# Patient Record
Sex: Male | Born: 1955 | Race: Black or African American | Hispanic: No | Marital: Single | Smoking: Former smoker
Health system: Southern US, Community
[De-identification: ages and names within clinical notes are randomized; demographics above are authoritative.]

## PROBLEM LIST (undated history)

## (undated) DIAGNOSIS — I10 Essential (primary) hypertension: Secondary | ICD-10-CM

## (undated) DIAGNOSIS — E785 Hyperlipidemia, unspecified: Secondary | ICD-10-CM

## (undated) DIAGNOSIS — I639 Cerebral infarction, unspecified: Secondary | ICD-10-CM

---

## 2001-12-14 ENCOUNTER — Encounter: Payer: Self-pay | Admitting: Emergency Medicine

## 2001-12-14 ENCOUNTER — Inpatient Hospital Stay (HOSPITAL_COMMUNITY): Admission: EM | Admit: 2001-12-14 | Discharge: 2001-12-15 | Payer: Self-pay | Admitting: Emergency Medicine

## 2001-12-15 ENCOUNTER — Encounter: Payer: Self-pay | Admitting: Internal Medicine

## 2001-12-26 ENCOUNTER — Encounter: Admission: RE | Admit: 2001-12-26 | Discharge: 2002-03-26 | Payer: Self-pay | Admitting: Neurology

## 2002-09-10 ENCOUNTER — Emergency Department (HOSPITAL_COMMUNITY): Admission: EM | Admit: 2002-09-10 | Discharge: 2002-09-10 | Payer: Self-pay | Admitting: Emergency Medicine

## 2002-09-10 ENCOUNTER — Encounter: Payer: Self-pay | Admitting: Emergency Medicine

## 2011-05-24 ENCOUNTER — Encounter (HOSPITAL_COMMUNITY): Payer: Self-pay | Admitting: *Deleted

## 2011-05-24 ENCOUNTER — Emergency Department (HOSPITAL_COMMUNITY)
Admission: EM | Admit: 2011-05-24 | Discharge: 2011-05-24 | Disposition: A | Discharge status: Home / Self Care | Payer: BC Managed Care – PPO | Attending: Emergency Medicine | Admitting: Emergency Medicine

## 2011-05-24 DIAGNOSIS — Z8679 Personal history of other diseases of the circulatory system: Secondary | ICD-10-CM | POA: Insufficient documentation

## 2011-05-24 DIAGNOSIS — R300 Dysuria: Secondary | ICD-10-CM | POA: Insufficient documentation

## 2011-05-24 DIAGNOSIS — E785 Hyperlipidemia, unspecified: Secondary | ICD-10-CM | POA: Insufficient documentation

## 2011-05-24 DIAGNOSIS — N390 Urinary tract infection, site not specified: Secondary | ICD-10-CM

## 2011-05-24 DIAGNOSIS — Z79899 Other long term (current) drug therapy: Secondary | ICD-10-CM | POA: Insufficient documentation

## 2011-05-24 DIAGNOSIS — E119 Type 2 diabetes mellitus without complications: Secondary | ICD-10-CM | POA: Insufficient documentation

## 2011-05-24 DIAGNOSIS — I10 Essential (primary) hypertension: Secondary | ICD-10-CM | POA: Insufficient documentation

## 2011-05-24 HISTORY — DX: Hyperlipidemia, unspecified: E78.5

## 2011-05-24 HISTORY — DX: Cerebral infarction, unspecified: I63.9

## 2011-05-24 HISTORY — DX: Essential (primary) hypertension: I10

## 2011-05-24 LAB — URINALYSIS, ROUTINE W REFLEX MICROSCOPIC
Bilirubin Urine: NEGATIVE
Glucose, UA: NEGATIVE mg/dL
Ketones, ur: NEGATIVE mg/dL
Protein, ur: 100 mg/dL — AB

## 2011-05-24 LAB — URINE MICROSCOPIC-ADD ON

## 2011-05-24 MED ORDER — PHENAZOPYRIDINE HCL 200 MG PO TABS: 200.0000 mg | ORAL_TABLET | Freq: Three times a day (TID) | ORAL | Status: AC

## 2011-05-24 MED ORDER — CIPROFLOXACIN HCL 500 MG PO TABS: 500.0000 mg | ORAL_TABLET | Freq: Two times a day (BID) | ORAL | Status: AC

## 2011-05-24 NOTE — ED Notes (Addendum)
Patient complaining of urinary frequency since last night and dark-colored urine; patient denies history of UTIs.  Patient does report past history of kidney stones.  Patient denies pain at this time.  Patient alert and oriented x4; PERRL present.  Will continue to monitor.

## 2011-05-24 NOTE — ED Provider Notes (Signed)
Medical screening examination/treatment/procedure(s) were performed by non-physician practitioner and as supervising physician I was immediately available for consultation/collaboration.    Celene Kras, MD 05/24/11 2152

## 2011-05-24 NOTE — ED Notes (Signed)
Patient given discharge paperwork; went over discharge instructions with patient.  Instructed patient to take Cipro and Pyridium as directed, to follow up with primary care physician within three days, and to return to the ED with new, worsening, or concerning symptoms.

## 2011-05-24 NOTE — ED Notes (Signed)
Patient report onset of urinary frequency and only small amount of urine output since last night.  Patient also reports burning when urinating

## 2011-05-24 NOTE — Discharge Instructions (Signed)
Urinary Tract Infection Infections of the urinary tract can start in several places. A bladder infection (cystitis), a kidney infection (pyelonephritis), and a prostate infection (prostatitis) are different types of urinary tract infections (UTIs). They usually get better if treated with medicines (antibiotics) that kill germs. Take all the medicine until it is gone. You or your child may feel better in a few days, but TAKE ALL MEDICINE or the infection may not respond and may become more difficult to treat. HOME CARE INSTRUCTIONS   Drink enough water and fluids to keep the urine clear or pale yellow. Cranberry juice is especially recommended, in addition to large amounts of water.   Avoid caffeine, tea, and carbonated beverages. They tend to irritate the bladder.   Alcohol may irritate the prostate.   Only take over-the-counter or prescription medicines for pain, discomfort, or fever as directed by your caregiver.  To prevent further infections:  Empty the bladder often. Avoid holding urine for long periods of time.   After a bowel movement, women should cleanse from front to back. Use each tissue only once.   Empty the bladder before and after sexual intercourse.  FINDING OUT THE RESULTS OF YOUR TEST Not all test results are available during your visit. If your or your child's test results are not back during the visit, make an appointment with your caregiver to find out the results. Do not assume everything is normal if you have not heard from your caregiver or the medical facility. It is important for you to follow up on all test results. SEEK MEDICAL CARE IF:   There is back pain.   Your baby is older than 3 months with a rectal temperature of 100.5 F (38.1 C) or higher for more than 1 day.   Your or your child's problems (symptoms) are no better in 3 days. Return sooner if you or your child is getting worse.  SEEK IMMEDIATE MEDICAL CARE IF:   There is severe back pain or lower  abdominal pain.   You or your child develops chills.   You have a fever.   Your baby is older than 3 months with a rectal temperature of 102 F (38.9 C) or higher.   Your baby is 20 months old or younger with a rectal temperature of 100.4 F (38 C) or higher.   There is nausea or vomiting.   There is continued burning or discomfort with urination.  MAKE SURE YOU:   Understand these instructions.   Will watch your condition.   Will get help right away if you are not doing well or get worse.  Document Released: 12/22/2004 Document Revised: 11/24/2010 Document Reviewed: 07/27/2006 [facility] Patient Information 2012 [place], [place]. Your urine was cultured.  Please make a phone with Dr. Kevan Ny for followup testing, when you're antibiotics have been completed.  Please monitor your blood sugars carefully over the next week

## 2011-05-24 NOTE — ED Notes (Signed)
Urine culture requisition slip sent to lab as an add-on.

## 2011-05-24 NOTE — ED Notes (Signed)
Patient asked to change into gown. 

## 2011-05-24 NOTE — ED Provider Notes (Signed)
History     CSN: [id number]  Arrival date & time 05/24/11  1811   First MD Initiated Contact with Patient 05/24/11 2018      Chief Complaint  Patient presents with  . Urinary Frequency    (Consider location/radiation/quality/duration/timing/severity/associated sxs/prior treatment) HPI Comments: Patient with urinary frequency.  Last night has progressively gotten worse throughout the day.  He also states that when he sits down.  He feels some discomfort in his perineal area.  He does take his blood pressure medicine every other day.  He takes his diabetic medication on a daily basis.  He has not seen Dr. Kevan Ny since November  The history is provided by the patient.    Past Medical History  Diagnosis Date  . Hypertension   . Stroke   . Diabetes mellitus   . Hyperlipemia     History reviewed. No pertinent past surgical history.  No family history on file.  History  Substance Use Topics  . Smoking status: Never Smoker   . Smokeless tobacco: Not on file  . Alcohol Use: Yes      Review of Systems  Constitutional: Negative for fever.  Genitourinary: Positive for dysuria, urgency, frequency and decreased urine volume. Negative for discharge, penile swelling, penile pain and testicular pain.    Allergies  Shellfish allergy  Home Medications   Current Outpatient Rx  Name Route Sig Dispense Refill  . ASPIRIN EC 325 MG PO TBEC Oral Take 325 mg by mouth daily.    Marland Kitchen VITAMIN D 1000 UNITS PO TABS Oral Take 1,000 Units by mouth daily.    Marland Kitchen LISINOPRIL-HYDROCHLOROTHIAZIDE 10-12.5 MG PO TABS Oral Take 1 tablet by mouth every other day.    Marland Kitchen METFORMIN HCL 500 MG PO TABS Oral Take 500 mg by mouth 2 (two) times daily with a meal.    . SIMVASTATIN 40 MG PO TABS Oral Take 40 mg by mouth at bedtime.    Marland Kitchen CIPROFLOXACIN HCL 500 MG PO TABS Oral Take 1 tablet (500 mg total) by mouth every 12 (twelve) hours. 10 tablet 0  . PHENAZOPYRIDINE HCL 200 MG PO TABS Oral Take 1 tablet (200 mg  total) by mouth 3 (three) times daily. 6 tablet 0    BP 112/78  Pulse 101  Temp(Src) 98.5 F (36.9 C) (Oral)  Resp 16  SpO2 98%  Physical Exam  Constitutional: He is oriented to person, place, and time. He appears well-developed and well-nourished.  HENT:  Head: Normocephalic.  Neck: Normal range of motion.  Pulmonary/Chest: Effort normal.  Abdominal: Bowel sounds are normal. There is no tenderness.  Genitourinary: Rectum normal and prostate normal.  Neurological: He is alert and oriented to person, place, and time.  Skin: Skin is warm.    ED Course  Procedures (including critical care time)  Labs Reviewed  URINALYSIS, ROUTINE W REFLEX MICROSCOPIC - Abnormal; Notable for the following:    APPearance CLOUDY (*)    Hgb urine dipstick LARGE (*)    Protein, ur 100 (*)    Nitrite POSITIVE (*)    Leukocytes, UA LARGE (*)    All other components within normal limits  URINE MICROSCOPIC-ADD ON - Abnormal; Notable for the following:    Bacteria, UA FEW (*)    All other components within normal limits  URINE CULTURE   No results found.   1. Urinary tract infection       MDM  Prostate is, nontender.  We'll treat for UTI with Cipro and Pyridium for  one week        Arman Filter, NP 05/24/11 2112  Arman Filter, NP 05/24/11 2112

## 2011-05-24 NOTE — ED Notes (Signed)
Gail, FNP at bedside. 

## 2011-05-27 LAB — URINE CULTURE

## 2011-05-28 NOTE — ED Notes (Signed)
Treated per protocol MD; Sensitive to same 

## 2013-06-27 ENCOUNTER — Encounter: Payer: Self-pay | Admitting: Neurology

## 2013-06-27 ENCOUNTER — Ambulatory Visit (INDEPENDENT_AMBULATORY_CARE_PROVIDER_SITE_OTHER): Payer: BC Managed Care – PPO | Admitting: Neurology

## 2013-06-27 VITALS — BP 140/80 | HR 78 | Temp 98.2°F | Resp 18 | Ht 71.0 in | Wt 223.4 lb

## 2013-06-27 DIAGNOSIS — E1165 Type 2 diabetes mellitus with hyperglycemia: Secondary | ICD-10-CM

## 2013-06-27 DIAGNOSIS — I679 Cerebrovascular disease, unspecified: Secondary | ICD-10-CM | POA: Insufficient documentation

## 2013-06-27 DIAGNOSIS — I10 Essential (primary) hypertension: Secondary | ICD-10-CM

## 2013-06-27 DIAGNOSIS — E119 Type 2 diabetes mellitus without complications: Secondary | ICD-10-CM | POA: Insufficient documentation

## 2013-06-27 DIAGNOSIS — IMO0002 Reserved for concepts with insufficient information to code with codable children: Secondary | ICD-10-CM

## 2013-06-27 DIAGNOSIS — E785 Hyperlipidemia, unspecified: Secondary | ICD-10-CM

## 2013-06-27 DIAGNOSIS — E118 Type 2 diabetes mellitus with unspecified complications: Secondary | ICD-10-CM

## 2013-06-27 NOTE — Progress Notes (Signed)
NEUROLOGY CONSULTATION NOTE  Jason Benton MRN: [id number] DOB: [birth year]  Referring provider: Dr. Kevan NyGates Primary care provider: Dr. Kevan NyGates  Reason for consult:  History of stroke.  Yearly physical as required by DOT.  HISTORY OF PRESENT ILLNESS: Jason Benton is a 58 year old right-handed man with history of type II diabetes mellitus, hypertension, hyperlipidemia, chronic kidney disease stage III, BPH, erectile dysfunction, prior pulmonary sarcoidosis, vitamin d deficiency and history of stroke (2003) who presents for stroke.  Records and images were personally reviewed where available.    He had a stroke in 2003.  He presented with left-sided weakness, involving his arm and leg.  He was found to have a lacunar infarct involving the right posterior limb of the internal capsule which extended into the periventricular white matter.  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  He was started on aspirin and Plavix, and the Plavix was eventually discontinued.  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  He has had difficulty controlling his blood pressure, diabetes and hyperlipidemia.  He does not adhere to a diabetic or cardiac diet.  He does not exercise.  He still has some trouble with his left leg when he walks.  He has not had any further strokes.  He works as a Naval architecttruck driver for [employer] and requires yearly evaluation by a neurologist.  10/14/12 LABS:  Hgb A1c 11.5, serum glucose 267, LDL 115, BUN 25, Cr 1.60, TSH 0.70, Vit D 25(OH) 23.3  PAST MEDICAL HISTORY: Past Medical History  Diagnosis Date  . Hypertension   . Stroke   . Diabetes mellitus   . Hyperlipemia     PAST SURGICAL HISTORY: No past surgical history on file.  MEDICATIONS: Current Outpatient Prescriptions on File Prior to Visit    Medication Sig Dispense Refill  . aspirin EC 325 MG tablet Take 325 mg by mouth daily.      . cholecalciferol (VITAMIN D) 1000 UNITS tablet Take 1,000 Units by mouth daily.      Marland Kitchen. lisinopril-hydrochlorothiazide (PRINZIDE,ZESTORETIC) 10-12.5 MG per tablet Take 1 tablet by mouth every other day.      . metFORMIN (GLUCOPHAGE) 500 MG tablet Take 500 mg by mouth 2 (two) times daily with a meal.      . simvastatin (ZOCOR) 40 MG tablet Take 40 mg by mouth at bedtime.       No current facility-administered medications on file prior to visit.    ALLERGIES: Allergies  Allergen Reactions  . Shellfish Allergy     FAMILY HISTORY: No family history on file.  SOCIAL HISTORY: History   Social History  . Marital Status: Single    Spouse Name: N/A    Number of Children: N/A  . Years of Education: N/A   Occupational History  . Not on file.   Social History Main Topics  . Smoking status: Former Games developermoker  . Smokeless tobacco: Not on file  . Alcohol Use: Yes  . Drug Use: No  . Sexual Activity: Yes    Partners: Female   Other Topics Concern  . Not on file   Social History Narrative  . No narrative on file    REVIEW OF SYSTEMS: Constitutional: No fevers, chills, or sweats, no generalized fatigue, change in appetite Eyes: No visual changes, double vision, eye pain Ear, nose and  throat: No hearing loss, ear pain, nasal congestion, sore throat Cardiovascular: No chest pain, palpitations Respiratory:  No shortness of breath at rest or with exertion, wheezes GastrointestinaI: No nausea, vomiting, diarrhea, abdominal pain, fecal incontinence Genitourinary:  No dysuria, urinary retention or frequency Musculoskeletal:  No neck pain, back pain Integumentary: No rash, pruritus, skin lesions Neurological: as above Psychiatric: No depression, insomnia, anxiety Endocrine: No palpitations, fatigue, diaphoresis, mood swings, change in appetite, change in weight, increased  thirst Hematologic/Lymphatic:  No anemia, purpura, petechiae. Allergic/Immunologic: no itchy/runny eyes, nasal congestion, recent allergic reactions, rashes  PHYSICAL EXAM: Filed Vitals:   06/27/13 0932  BP: 140/80  Pulse: 78  Temp: 98.2 F (36.8 C)  Resp: 18   General: No acute distress Head:  Normocephalic/atraumatic Neck: supple, no paraspinal tenderness, full range of motion Back: No paraspinal tenderness Heart: regular rate and rhythm Lungs: Clear to auscultation bilaterally. Vascular: No carotid bruits. Neurological Exam: Mental status: alert and oriented to person, place, and time, recent and remote memory intact, fund of knowledge intact, attention and concentration intact, speech fluent and not dysarthric, language intact. Cranial nerves: CN I: not tested CN II: pupils equal, round and reactive to light, monocular central vision loss in left eye, fundi unremarkable, without vessel changes, exudates, hemorrhages or papilledema. CN III, IV, VI:  full range of motion, no nystagmus, no ptosis CN V: facial sensation intact CN VII: upper and lower face symmetric CN VIII: hearing intact CN IX, X: gag intact, uvula midline CN XI: sternocleidomastoid and trapezius muscles intact CN XII: tongue midline Bulk & Tone: normal, no fasciculations. Motor: 5/5 throughout Sensation: pinprick and vibration intact. Deep Tendon Reflexes: 3+ left brachioradialis, biceps and patellar, otherwise 2+.  Toes down. Finger to nose testing: no dysmetria Heel to shin: no dysmetria. Gait: Let leg circumduction.  No ataxia.  Able to turn, walk on toes and heels.  Mild unsteadiness walking in tandem. Romberg negative.  IMPRESSION: 1.  History of stroke, likely small vessel disease vs hypertension.  From my standpoint, he seems stable and okay to drive.  PLAN: 1.  Continue aspirin daily 2.  Optimize blood pressure and glycemic control 3.  Optimize cholesterol control (LDL goal should be less than  70). 4.  Encouraged regular exercise 5.  Encouraged Mediterranean diet.  Recommended to refer him to a nutritionist.  He wants to think about it. 6.  Will check carotid dopplers to make sure there isn't any hemodynamically significant stenosis 7.  Follow up in one year or as needed.  45 minutes spent with patient, over 50% spent counseling and coordinating care.  Thank you for allowing me to take part in the care of this patient.  Shon Millet, DO  CC:  Marden Noble, MD

## 2013-06-27 NOTE — Patient Instructions (Addendum)
1.  Continue aspirin daily 2.  Optimize blood pressure and diabetes control 3.  Optimize cholesterol control 4.  Adhere to a Mediterranean diet (consider seeing a nutritionist). 5.  Exercise at least 30 minutes 3 times a week. 6.  We will get a carotid doppler to look for any blockage of the arteries in the neck 06[facility]28/13 [facility] 11:15 [facility]  7.  Follow up in one year or as needed.

## 2013-06-28 ENCOUNTER — Other Ambulatory Visit (HOSPITAL_COMMUNITY): Payer: Self-pay | Admitting: Neurology

## 2013-06-28 ENCOUNTER — Ambulatory Visit (HOSPITAL_COMMUNITY)
Admission: RE | Admit: 2013-06-28 | Discharge: 2013-06-28 | Disposition: A | Discharge status: Home / Self Care | Payer: BC Managed Care – PPO | Source: Ambulatory Visit | Attending: Neurology | Admitting: Neurology

## 2013-06-28 DIAGNOSIS — I679 Cerebrovascular disease, unspecified: Secondary | ICD-10-CM

## 2013-06-28 DIAGNOSIS — Z8673 Personal history of transient ischemic attack (TIA), and cerebral infarction without residual deficits: Secondary | ICD-10-CM | POA: Insufficient documentation

## 2013-06-28 DIAGNOSIS — I639 Cerebral infarction, unspecified: Secondary | ICD-10-CM

## 2013-06-28 NOTE — Progress Notes (Signed)
*  PRELIMINARY RESULTS* Vascular Ultrasound Carotid Duplex (Doppler) has been completed.  Preliminary findings: Bilateral:  1-39% ICA stenosis.  Left Vertebral artery flow is antegrade.  Right vertebral artery was not visualized, possibly occluded.     Farrel DemarkJill Eunice, RDMS, RVT  06/28/2013, 11:48 AM

## 2013-07-01 ENCOUNTER — Telehealth: Payer: Self-pay | Admitting: Neurology

## 2013-07-01 NOTE — Telephone Encounter (Signed)
Patient is aware of normal test  

## 2013-07-01 NOTE — Telephone Encounter (Signed)
Pt states that someone called him he thinks it was about some test

## 2013-07-02 ENCOUNTER — Telehealth: Payer: Self-pay | Admitting: Neurology

## 2013-07-02 NOTE — Telephone Encounter (Signed)
Called patient to make him aware- patient already made aware yesterday.

## 2013-07-02 NOTE — Telephone Encounter (Signed)
Message copied by Silvio PateMCCRACKEN, JADE L on Tue Jul 02, 2013 12:08 PM ------      Message from: JAFFE, ADAM R      Created: Mon Jul 01, 2013 11:15 AM       Carotid dopplers look okay.      ----- Message -----         From: [facility] Interface         Sent: 07/01/2013  10:15 AM           To: Cira ServantAdam Robert Jaffe, DO                   ------

## 2014-05-01 ENCOUNTER — Ambulatory Visit (INDEPENDENT_AMBULATORY_CARE_PROVIDER_SITE_OTHER): Payer: Self-pay | Admitting: Family Medicine

## 2014-05-01 VITALS — BP 126/76 | HR 92 | Temp 98.2°F | Resp 18 | Ht 70.0 in | Wt 222.2 lb

## 2014-05-01 DIAGNOSIS — Z024 Encounter for examination for driving license: Secondary | ICD-10-CM

## 2014-05-01 DIAGNOSIS — Z029 Encounter for administrative examinations, unspecified: Secondary | ICD-10-CM

## 2014-05-01 NOTE — Patient Instructions (Signed)
Return as needed or in one year

## 2014-05-01 NOTE — Progress Notes (Signed)
Physical examination:    history: Patient is here for a physical exam for his DOT card. He saw his primary care provider earlier this week and has been doing well. He goes regularly.  Past medical history:  surgeries: None  illnesses: diabetes, hypertension, hyperlipidemia   medications: See list Allergies: no known allergies  Social history:  he drives a truck locally. Single. He is trying to take better care of his diabetes now.  Review of systems:   essentially unremarkable  he sees his eye doctor regularly. Other than wearing glasses he does not have any diabetic retinopathy.   Physical exam:  healthy-appearing man in no acute distress. TMs normal. Eyes PERRLA. Fundi benign. Throat clear. Neck supple without nodes or thyromegaly. No carotid bruits. Chest is clear to auscultation. Heart regular without murmurs. Abdomen soft without masses tenderness. Normal male external genitalia, uncircumcised, no hernias. Extremities unremarkable. Pulses in feet good. Spine normal.  Assessment: Normal physical examination for DOT  hypertension  Diabetes  Plan:  one year card

## 2015-04-17 ENCOUNTER — Ambulatory Visit (INDEPENDENT_AMBULATORY_CARE_PROVIDER_SITE_OTHER): Payer: Self-pay | Admitting: Physician Assistant

## 2015-04-17 ENCOUNTER — Telehealth: Payer: Self-pay

## 2015-04-17 VITALS — BP 126/80 | HR 95 | Temp 98.3°F | Resp 20 | Ht 71.0 in | Wt 224.8 lb

## 2015-04-17 DIAGNOSIS — IMO0001 Reserved for inherently not codable concepts without codable children: Secondary | ICD-10-CM

## 2015-04-17 DIAGNOSIS — Z0289 Encounter for other administrative examinations: Secondary | ICD-10-CM

## 2015-04-17 DIAGNOSIS — E1165 Type 2 diabetes mellitus with hyperglycemia: Secondary | ICD-10-CM

## 2015-04-17 DIAGNOSIS — Z021 Encounter for pre-employment examination: Secondary | ICD-10-CM

## 2015-04-17 LAB — POCT GLYCOSYLATED HEMOGLOBIN (HGB A1C): Hemoglobin A1C: 10.4

## 2015-04-17 NOTE — Telephone Encounter (Signed)
error 

## 2015-04-17 NOTE — Progress Notes (Signed)
Commercial Driver Medical Examination   Jason Benton is a 60 y.o. male who presents today for a commercial driver fitness determination physical exam. The patient reports no problems. The following portions of the patient's history were reviewed and updated as appropriate: allergies, current medications, past family history, past medical history, past social history, past surgical history and problem list.  Hx DM2. Takes metformin 500 BID. PCP-Dr. Robley Fries at [facility]. Last saw July 2016. States his A1C at that point was "9 something". No changes to medication at that time. States he didn't know he was supposed to see his provider every 3 months if he has DM. He was diagnosed with DM 2 years ago. He does not take his sugars regularly. He does not put much emphasis on his diet. He denies cp, sob, paresthesias, blurred vision.  HLD - takes simvastatin 40 mg QD.  HTN - takes lisinopril daily.  No surghx.  Denies hx osa, liver or kidney disease, lung disease. No daytime somnolence. He does not smoke. occ alcohol use.  Review of Systems Pertinent items are noted in HPI.   Objective:    Vision/hearing:  Visual Acuity Screening   Right eye Left eye Both eyes  Without correction:     With correction:  Comments: Peripheral Vision: Right eye 85 degrees. Left eye 85 degrees.The patient can distinguish the colors red, amber and green.  Hearing Screening Comments: The patient was able to hear a forced whisper from 10 feet.  Applicant can recognize and distinguish among traffic control signals and devices showing standard red, green, and amber colors.  Corrective lenses required: Yes  Monocular Vision?: No  Hearing aid requirement: No  Physical Exam  Constitutional: He is oriented to person, place, and time. He appears well-developed and well-nourished.  HENT:  Head: Normocephalic and atraumatic.  Right Ear: Hearing, tympanic membrane, external ear and ear  canal normal.  Left Ear: Hearing, tympanic membrane, external ear and ear canal normal.  Nose: Nose normal.  Mouth/Throat: Uvula is midline, oropharynx is clear and moist and mucous membranes are normal.  Eyes: Conjunctivae, EOM and lids are normal. Pupils are equal, round, and reactive to light. Right eye exhibits no discharge. Left eye exhibits no discharge. No scleral icterus.  Neck: Trachea normal and normal range of motion. Neck supple. Carotid bruit is not present.  Cardiovascular: Normal rate, regular rhythm, normal heart sounds, intact distal pulses and normal pulses.   No murmur heard. Pulmonary/Chest: Effort normal and breath sounds normal. No respiratory distress. He has no wheezes. He has no rhonchi. He has no rales.  Abdominal: Soft. Normal appearance. There is no tenderness.  Musculoskeletal: Normal range of motion. He exhibits no edema or tenderness.  Lymphadenopathy:       Head (right side): No submental, no submandibular and no tonsillar adenopathy present.       Head (left side): No submental, no submandibular and no tonsillar adenopathy present.    He has no cervical adenopathy.  Neurological: He is alert and oriented to person, place, and time. He has normal strength and normal reflexes. No cranial nerve deficit or sensory deficit. Coordination and gait normal.  Skin: Skin is warm, dry and intact. No lesion and no rash noted.  Psychiatric: He has a normal mood and affect. His speech is normal and behavior is normal. Judgment and thought content normal.    BP 126/80 mmHg  Pulse 95  Temp(Src) 98.3 F (36.8 C) (Oral)  Resp 20  Ht   (1.803 m)  Wt 224 lb 12.8 oz (101.969 kg)  BMI 31.37 kg/m2  SpO2 97%  Labs: Comments: Sp. Gr: 1.015 Protein: Neg. Blood: Neg. Sugar: > 2000   Results for orders placed or performed in visit on 04/17/15  POCT glycosylated hemoglobin (Hb A1C)  Result Value Ref Range   Hemoglobin A1C 10.4    Assessment:    Healthy male exam.   Meets standards, but periodic monitoring required due to uncontrolled diabetes type 2.  Driver qualified only for 3 months.    Plan:    Medical examiners certificate completed and printed. Need follow-up in 3 months for recheck of diabetes type 2.  Had long discussion with pt - DOT recommends A1C under 10% although this is >10% not an automatic disqualification. I recommended he make appt with PCP asap for medication review and changes. Return in 3 months for A1C recheck or bring documentation that A1C <10%. If under 10, will certify for remainder of year.

## 2015-04-24 ENCOUNTER — Ambulatory Visit (INDEPENDENT_AMBULATORY_CARE_PROVIDER_SITE_OTHER): Payer: BLUE CROSS/BLUE SHIELD | Admitting: Physician Assistant

## 2015-04-24 VITALS — BP 98/62 | HR 90 | Temp 98.1°F | Resp 18 | Ht 71.0 in | Wt 218.0 lb

## 2015-04-24 DIAGNOSIS — R7309 Other abnormal glucose: Secondary | ICD-10-CM | POA: Diagnosis not present

## 2015-04-24 DIAGNOSIS — Z23 Encounter for immunization: Secondary | ICD-10-CM | POA: Diagnosis not present

## 2015-04-24 LAB — POCT GLYCOSYLATED HEMOGLOBIN (HGB A1C): Hemoglobin A1C: 9.7

## 2015-04-24 NOTE — Progress Notes (Signed)
   04/24/2015 1:47 PM   DOB: [birth year] / MRN: [id number]  SUBJECTIVE:  Jason Benton is a 60 y.o. male presenting for a recheck of his A1c.  Per Nicole's last note he could be given a DOT card for 1 year if his A1c is less than 10.    He is allergic to shellfish allergy.   He  has a past medical history of Hypertension; Stroke (HCC); Diabetes mellitus; and Hyperlipemia.    He  reports that he has quit smoking. He does not have any smokeless tobacco history on file. He reports that he drinks alcohol. He reports that he does not use illicit drugs. He  reports that he currently engages in sexual activity and has had male partners. The patient  has no past surgical history on file.  His family history includes Hypertension in his sister; Stroke in his father.  Review of Systems  Constitutional: Negative for fever and chills.  Eyes: Negative for blurred vision.  Respiratory: Negative for cough and shortness of breath.   Cardiovascular: Negative for chest pain.  Gastrointestinal: Negative for nausea and abdominal pain.  Genitourinary: Negative for dysuria, urgency and frequency.  Musculoskeletal: Negative for myalgias.  Skin: Negative for rash.  Neurological: Negative for dizziness, tingling and headaches.  Psychiatric/Behavioral: Negative for depression. The patient is not nervous/anxious.     Problem list and medications reviewed and updated by myself where necessary, and exist elsewhere in the encounter.   OBJECTIVE:  BP 98/62 mmHg  Pulse 90  Temp(Src) 98.1 F (36.7 C) (Oral)  Resp 18  Ht  (1.803 m)  Wt 218 lb (98.884 kg)  BMI 30.42 kg/m2  SpO2 98%  Physical Exam  Constitutional: He is oriented to person, place, and time. He appears well-developed. He does not appear ill.  Eyes: Conjunctivae and EOM are normal. Pupils are equal, round, and reactive to light.  Cardiovascular: Normal rate.   Pulmonary/Chest: Effort normal.  Abdominal: He exhibits no distension.    Musculoskeletal: Normal range of motion.  Neurological: He is alert and oriented to person, place, and time. No cranial nerve deficit. Coordination normal.  Skin: Skin is warm and dry. He is not diaphoretic.  Psychiatric: He has a normal mood and affect.  Nursing note and vitals reviewed.   Results for orders placed or performed in visit on 04/24/15 (from the past 72 hour(s))  POCT glycosylated hemoglobin (Hb A1C)     Status: None   Collection Time: 04/24/15  1:46 PM  Result Value Ref Range   Hemoglobin A1C 9.7      No results found.  ASSESSMENT AND PLAN  Glendell was seen today for labs only and immunizations.  Diagnoses and all orders for this visit:  Hemoglobin A1c 8.0 or greater: Patient with an A1c of 9.7.  Will provide a year's card per Nicole's note.  He has no other complaints today and has a PCP appointment in place.   -     POCT glycosylated hemoglobin (Hb A1C) -     Flu Vaccine QUAD 36+ mos IM    The patient was advised to call or return to clinic if he does not see an improvement in symptoms or to seek the care of the closest emergency department if he worsens with the above plan.   Deliah Boston, MHS, PA-C [facility] 04/24/2015 1:47 PM

## 2015-11-06 DIAGNOSIS — E119 Type 2 diabetes mellitus without complications: Secondary | ICD-10-CM | POA: Diagnosis not present

## 2015-12-22 DIAGNOSIS — M7580 Other shoulder lesions, unspecified shoulder: Secondary | ICD-10-CM | POA: Diagnosis not present

## 2015-12-22 DIAGNOSIS — E1140 Type 2 diabetes mellitus with diabetic neuropathy, unspecified: Secondary | ICD-10-CM | POA: Diagnosis not present

## 2015-12-22 DIAGNOSIS — Z23 Encounter for immunization: Secondary | ICD-10-CM | POA: Diagnosis not present

## 2015-12-22 DIAGNOSIS — E1165 Type 2 diabetes mellitus with hyperglycemia: Secondary | ICD-10-CM | POA: Diagnosis not present

## 2015-12-22 DIAGNOSIS — I10 Essential (primary) hypertension: Secondary | ICD-10-CM | POA: Diagnosis not present

## 2015-12-22 DIAGNOSIS — Z0001 Encounter for general adult medical examination with abnormal findings: Secondary | ICD-10-CM | POA: Diagnosis not present

## 2015-12-22 DIAGNOSIS — Z125 Encounter for screening for malignant neoplasm of prostate: Secondary | ICD-10-CM | POA: Diagnosis not present

## 2015-12-22 DIAGNOSIS — Z6830 Body mass index (BMI) 30.0-30.9, adult: Secondary | ICD-10-CM | POA: Diagnosis not present

## 2015-12-22 DIAGNOSIS — N5201 Erectile dysfunction due to arterial insufficiency: Secondary | ICD-10-CM | POA: Diagnosis not present

## 2015-12-22 DIAGNOSIS — E559 Vitamin D deficiency, unspecified: Secondary | ICD-10-CM | POA: Diagnosis not present

## 2015-12-22 DIAGNOSIS — N183 Chronic kidney disease, stage 3 (moderate): Secondary | ICD-10-CM | POA: Diagnosis not present

## 2015-12-22 DIAGNOSIS — Z79899 Other long term (current) drug therapy: Secondary | ICD-10-CM | POA: Diagnosis not present

## 2015-12-22 DIAGNOSIS — Z7984 Long term (current) use of oral hypoglycemic drugs: Secondary | ICD-10-CM | POA: Diagnosis not present

## 2016-02-12 DIAGNOSIS — L03032 Cellulitis of left toe: Secondary | ICD-10-CM | POA: Diagnosis not present

## 2016-02-12 DIAGNOSIS — S91109A Unspecified open wound of unspecified toe(s) without damage to nail, initial encounter: Secondary | ICD-10-CM | POA: Diagnosis not present

## 2016-02-25 DIAGNOSIS — T8189XA Other complications of procedures, not elsewhere classified, initial encounter: Secondary | ICD-10-CM | POA: Diagnosis not present

## 2016-02-26 DIAGNOSIS — N5201 Erectile dysfunction due to arterial insufficiency: Secondary | ICD-10-CM | POA: Diagnosis not present

## 2016-02-26 DIAGNOSIS — E1165 Type 2 diabetes mellitus with hyperglycemia: Secondary | ICD-10-CM | POA: Diagnosis not present

## 2016-02-26 DIAGNOSIS — Z79899 Other long term (current) drug therapy: Secondary | ICD-10-CM | POA: Diagnosis not present

## 2016-02-26 DIAGNOSIS — E782 Mixed hyperlipidemia: Secondary | ICD-10-CM | POA: Diagnosis not present

## 2016-02-26 DIAGNOSIS — E559 Vitamin D deficiency, unspecified: Secondary | ICD-10-CM | POA: Diagnosis not present

## 2016-03-18 DIAGNOSIS — L97522 Non-pressure chronic ulcer of other part of left foot with fat layer exposed: Secondary | ICD-10-CM | POA: Diagnosis not present

## 2016-03-18 DIAGNOSIS — T8189XA Other complications of procedures, not elsewhere classified, initial encounter: Secondary | ICD-10-CM | POA: Diagnosis not present

## 2016-04-01 DIAGNOSIS — L97522 Non-pressure chronic ulcer of other part of left foot with fat layer exposed: Secondary | ICD-10-CM | POA: Diagnosis not present

## 2016-04-03 ENCOUNTER — Inpatient Hospital Stay (HOSPITAL_COMMUNITY)
Admission: EM | Admit: 2016-04-03 | Discharge: 2016-04-09 | DRG: 504 | Disposition: A | Discharge status: Home Health | Payer: BLUE CROSS/BLUE SHIELD | Attending: Internal Medicine | Admitting: Internal Medicine

## 2016-04-03 ENCOUNTER — Encounter (HOSPITAL_COMMUNITY): Payer: Self-pay | Admitting: Emergency Medicine

## 2016-04-03 ENCOUNTER — Observation Stay (HOSPITAL_COMMUNITY): Payer: BLUE CROSS/BLUE SHIELD

## 2016-04-03 ENCOUNTER — Emergency Department (HOSPITAL_COMMUNITY): Payer: BLUE CROSS/BLUE SHIELD

## 2016-04-03 DIAGNOSIS — N400 Enlarged prostate without lower urinary tract symptoms: Secondary | ICD-10-CM | POA: Diagnosis not present

## 2016-04-03 DIAGNOSIS — E1122 Type 2 diabetes mellitus with diabetic chronic kidney disease: Secondary | ICD-10-CM | POA: Diagnosis present

## 2016-04-03 DIAGNOSIS — Z8673 Personal history of transient ischemic attack (TIA), and cerebral infarction without residual deficits: Secondary | ICD-10-CM

## 2016-04-03 DIAGNOSIS — M869 Osteomyelitis, unspecified: Secondary | ICD-10-CM | POA: Diagnosis not present

## 2016-04-03 DIAGNOSIS — Z823 Family history of stroke: Secondary | ICD-10-CM

## 2016-04-03 DIAGNOSIS — Z7984 Long term (current) use of oral hypoglycemic drugs: Secondary | ICD-10-CM

## 2016-04-03 DIAGNOSIS — L0390 Cellulitis, unspecified: Secondary | ICD-10-CM | POA: Diagnosis present

## 2016-04-03 DIAGNOSIS — E785 Hyperlipidemia, unspecified: Secondary | ICD-10-CM | POA: Diagnosis present

## 2016-04-03 DIAGNOSIS — E1151 Type 2 diabetes mellitus with diabetic peripheral angiopathy without gangrene: Secondary | ICD-10-CM | POA: Diagnosis present

## 2016-04-03 DIAGNOSIS — E119 Type 2 diabetes mellitus without complications: Secondary | ICD-10-CM

## 2016-04-03 DIAGNOSIS — I129 Hypertensive chronic kidney disease with stage 1 through stage 4 chronic kidney disease, or unspecified chronic kidney disease: Secondary | ICD-10-CM | POA: Diagnosis not present

## 2016-04-03 DIAGNOSIS — R031 Nonspecific low blood-pressure reading: Secondary | ICD-10-CM | POA: Diagnosis present

## 2016-04-03 DIAGNOSIS — L02612 Cutaneous abscess of left foot: Secondary | ICD-10-CM

## 2016-04-03 DIAGNOSIS — E1121 Type 2 diabetes mellitus with diabetic nephropathy: Secondary | ICD-10-CM | POA: Diagnosis not present

## 2016-04-03 DIAGNOSIS — M7989 Other specified soft tissue disorders: Secondary | ICD-10-CM | POA: Diagnosis not present

## 2016-04-03 DIAGNOSIS — Z91013 Allergy to seafood: Secondary | ICD-10-CM

## 2016-04-03 DIAGNOSIS — Z7982 Long term (current) use of aspirin: Secondary | ICD-10-CM | POA: Diagnosis not present

## 2016-04-03 DIAGNOSIS — E1142 Type 2 diabetes mellitus with diabetic polyneuropathy: Secondary | ICD-10-CM | POA: Diagnosis not present

## 2016-04-03 DIAGNOSIS — E1169 Type 2 diabetes mellitus with other specified complication: Secondary | ICD-10-CM | POA: Diagnosis present

## 2016-04-03 DIAGNOSIS — I10 Essential (primary) hypertension: Secondary | ICD-10-CM | POA: Diagnosis not present

## 2016-04-03 DIAGNOSIS — Z87891 Personal history of nicotine dependence: Secondary | ICD-10-CM | POA: Diagnosis not present

## 2016-04-03 DIAGNOSIS — N179 Acute kidney failure, unspecified: Secondary | ICD-10-CM

## 2016-04-03 DIAGNOSIS — M25572 Pain in left ankle and joints of left foot: Secondary | ICD-10-CM | POA: Diagnosis present

## 2016-04-03 DIAGNOSIS — L03116 Cellulitis of left lower limb: Secondary | ICD-10-CM | POA: Diagnosis not present

## 2016-04-03 DIAGNOSIS — M86672 Other chronic osteomyelitis, left ankle and foot: Principal | ICD-10-CM | POA: Diagnosis present

## 2016-04-03 DIAGNOSIS — N182 Chronic kidney disease, stage 2 (mild): Secondary | ICD-10-CM | POA: Diagnosis not present

## 2016-04-03 DIAGNOSIS — E11621 Type 2 diabetes mellitus with foot ulcer: Secondary | ICD-10-CM | POA: Diagnosis present

## 2016-04-03 DIAGNOSIS — E1165 Type 2 diabetes mellitus with hyperglycemia: Secondary | ICD-10-CM | POA: Diagnosis present

## 2016-04-03 DIAGNOSIS — Z8249 Family history of ischemic heart disease and other diseases of the circulatory system: Secondary | ICD-10-CM

## 2016-04-03 DIAGNOSIS — L03032 Cellulitis of left toe: Secondary | ICD-10-CM

## 2016-04-03 DIAGNOSIS — G8918 Other acute postprocedural pain: Secondary | ICD-10-CM | POA: Diagnosis not present

## 2016-04-03 DIAGNOSIS — I679 Cerebrovascular disease, unspecified: Secondary | ICD-10-CM | POA: Diagnosis present

## 2016-04-03 LAB — URINALYSIS, ROUTINE W REFLEX MICROSCOPIC
BACTERIA UA: NONE SEEN
Bilirubin Urine: NEGATIVE
Hgb urine dipstick: NEGATIVE
KETONES UR: NEGATIVE mg/dL
LEUKOCYTES UA: NEGATIVE
Nitrite: NEGATIVE
PROTEIN: NEGATIVE mg/dL
SQUAMOUS EPITHELIAL / LPF: NONE SEEN
Specific Gravity, Urine: 1.017 (ref 1.005–1.030)
WBC UA: NONE SEEN WBC/hpf (ref 0–5)
pH: 7 (ref 5.0–8.0)

## 2016-04-03 LAB — CBC WITH DIFFERENTIAL/PLATELET
BASOS ABS: 0 10*3/uL (ref 0.0–0.1)
Basophils Relative: 0 %
EOS PCT: 1 %
Eosinophils Absolute: 0.1 10*3/uL (ref 0.0–0.7)
HCT: 41 % (ref 39.0–52.0)
Hemoglobin: 14 g/dL (ref 13.0–17.0)
LYMPHS ABS: 1.6 10*3/uL (ref 0.7–4.0)
LYMPHS PCT: 21 %
MCH: 28.6 pg (ref 26.0–34.0)
MCHC: 34.1 g/dL (ref 30.0–36.0)
MCV: 83.8 fL (ref 78.0–100.0)
MONO ABS: 0.3 10*3/uL (ref 0.1–1.0)
MONOS PCT: 5 %
NEUTROS ABS: 5.4 10*3/uL (ref 1.7–7.7)
Neutrophils Relative %: 73 %
Platelets: 161 10*3/uL (ref 150–400)
RBC: 4.89 MIL/uL (ref 4.22–5.81)
RDW: 12 % (ref 11.5–15.5)
WBC: 7.5 10*3/uL (ref 4.0–10.5)

## 2016-04-03 LAB — PHOSPHORUS: PHOSPHORUS: 2.4 mg/dL — AB (ref 2.5–4.6)

## 2016-04-03 LAB — BASIC METABOLIC PANEL
ANION GAP: 7 (ref 5–15)
BUN: 23 mg/dL — AB (ref 6–20)
CALCIUM: 9.2 mg/dL (ref 8.9–10.3)
CO2: 25 mmol/L (ref 22–32)
CREATININE: 1.52 mg/dL — AB (ref 0.61–1.24)
Chloride: 104 mmol/L (ref 101–111)
GFR calc Af Amer: 56 mL/min — ABNORMAL LOW (ref >60)
GFR, EST NON AFRICAN AMERICAN: 48 mL/min — AB (ref >60)
GLUCOSE: 331 mg/dL — AB (ref 65–99)
Potassium: 4.3 mmol/L (ref 3.5–5.1)
Sodium: 136 mmol/L (ref 135–145)

## 2016-04-03 LAB — GLUCOSE, CAPILLARY
Glucose-Capillary: 297 mg/dL — ABNORMAL HIGH (ref 65–99)
Glucose-Capillary: 202 mg/dL — ABNORMAL HIGH (ref 65–99)

## 2016-04-03 LAB — MAGNESIUM: Magnesium: 2.2 mg/dL (ref 1.7–2.4)

## 2016-04-03 LAB — CREATININE, URINE, RANDOM: CREATININE, URINE: 62.59 mg/dL

## 2016-04-03 MED ORDER — HYDRALAZINE HCL 20 MG/ML IJ SOLN
10.0000 mg | Freq: Three times a day (TID) | INTRAMUSCULAR | Status: DC | PRN
Start: 2016-04-03 — End: 2016-04-09

## 2016-04-03 MED ORDER — SODIUM CHLORIDE 0.9 % IV SOLN
INTRAVENOUS | Status: DC
  Administered 2016-04-03 – 2016-04-05 (×4): via INTRAVENOUS

## 2016-04-03 MED ORDER — ENOXAPARIN SODIUM 60 MG/0.6ML ~~LOC~~ SOLN
0.5000 mg/kg | SUBCUTANEOUS | Status: DC
  Administered 2016-04-03 – 2016-04-08 (×6): 50 mg via SUBCUTANEOUS
  Filled 2016-04-03 (×6): qty 0.6

## 2016-04-03 MED ORDER — PIPERACILLIN-TAZOBACTAM 3.375 G IVPB
3.3750 g | Freq: Three times a day (TID) | INTRAVENOUS | Status: DC
  Administered 2016-04-03 – 2016-04-09 (×17): 3.375 g via INTRAVENOUS
  Filled 2016-04-03 (×20): qty 50

## 2016-04-03 MED ORDER — PIPERACILLIN-TAZOBACTAM 3.375 G IVPB 30 MIN
3.3750 g | Freq: Once | INTRAVENOUS | Status: AC
  Administered 2016-04-03: 3.375 g via INTRAVENOUS
  Filled 2016-04-03: qty 50

## 2016-04-03 MED ORDER — HYDROCODONE-ACETAMINOPHEN 5-325 MG PO TABS
1.0000 | ORAL_TABLET | Freq: Once | ORAL | Status: AC
  Administered 2016-04-03: 1 via ORAL
  Filled 2016-04-03: qty 1

## 2016-04-03 MED ORDER — INSULIN ASPART 100 UNIT/ML ~~LOC~~ SOLN
0.0000 [IU] | Freq: Three times a day (TID) | SUBCUTANEOUS | Status: DC
  Administered 2016-04-03: 11 [IU] via SUBCUTANEOUS
  Administered 2016-04-04: 3 [IU] via SUBCUTANEOUS
  Administered 2016-04-04: 7 [IU] via SUBCUTANEOUS
  Administered 2016-04-04: 4 [IU] via SUBCUTANEOUS
  Administered 2016-04-05 (×2): 3 [IU] via SUBCUTANEOUS
  Administered 2016-04-05 – 2016-04-06 (×3): 4 [IU] via SUBCUTANEOUS
  Administered 2016-04-07: 3 [IU] via SUBCUTANEOUS
  Administered 2016-04-07 – 2016-04-09 (×4): 4 [IU] via SUBCUTANEOUS

## 2016-04-03 MED ORDER — ACETAMINOPHEN 325 MG PO TABS
650.0000 mg | ORAL_TABLET | Freq: Four times a day (QID) | ORAL | Status: DC | PRN
  Administered 2016-04-05: 650 mg via ORAL
  Filled 2016-04-03: qty 2

## 2016-04-03 MED ORDER — SIMVASTATIN 40 MG PO TABS
40.0000 mg | ORAL_TABLET | Freq: Every day | ORAL | Status: DC
  Administered 2016-04-03 – 2016-04-08 (×6): 40 mg via ORAL
  Filled 2016-04-03 (×6): qty 1

## 2016-04-03 MED ORDER — ENOXAPARIN SODIUM 40 MG/0.4ML ~~LOC~~ SOLN: 40.0000 mg | SUBCUTANEOUS | Status: DC

## 2016-04-03 MED ORDER — TAMSULOSIN HCL 0.4 MG PO CAPS
0.4000 mg | ORAL_CAPSULE | Freq: Every day | ORAL | Status: DC
  Administered 2016-04-03 – 2016-04-09 (×6): 0.4 mg via ORAL
  Filled 2016-04-03 (×6): qty 1

## 2016-04-03 MED ORDER — MORPHINE SULFATE (PF) 2 MG/ML IV SOLN
2.0000 mg | Freq: Once | INTRAVENOUS | Status: AC
  Administered 2016-04-03: 2 mg via INTRAVENOUS
  Filled 2016-04-03: qty 1

## 2016-04-03 MED ORDER — VANCOMYCIN HCL IN DEXTROSE 1-5 GM/200ML-% IV SOLN
1000.0000 mg | Freq: Two times a day (BID) | INTRAVENOUS | Status: DC
  Administered 2016-04-03 (×2): 1000 mg via INTRAVENOUS
  Filled 2016-04-03 (×3): qty 200

## 2016-04-03 MED ORDER — RISAQUAD PO CAPS
1.0000 | ORAL_CAPSULE | Freq: Every day | ORAL | Status: DC
  Administered 2016-04-03 – 2016-04-09 (×6): 1 via ORAL
  Filled 2016-04-03 (×6): qty 1

## 2016-04-03 MED ORDER — HYDROCODONE-ACETAMINOPHEN 5-325 MG PO TABS
1.0000 | ORAL_TABLET | ORAL | Status: DC | PRN
  Administered 2016-04-03 (×3): 1 via ORAL
  Filled 2016-04-03 (×3): qty 1

## 2016-04-03 NOTE — ED Notes (Signed)
Patient transported to X-ray 

## 2016-04-03 NOTE — H&P (Signed)
[facility] History and Physical  Jason Benton BJY:[id number] DOB: 04/13/1955 DOA: 04/03/2016  Referring physician:  PCP: Jason Dubonnet, MD   Chief Complaint: "My toe just kept getting worse."  HPI: Jason Benton is a 61 y.o. male  with past mental history of stroke, diabetes, hypertension, hyperlipidemia presents emergency room with chief complaint of toe pain and swelling. Patient reports that he had left great toenail pain. Went to a podiatrist for evaluation and the toenails removed. This is approximately 2 weeks ago. Patient will follow-up as toe was red and looked a little infected. Patient was prescribed an antibiotic pill and cream. Patient took the pill as directed. The cream was to come to order never arrived. Patient went for evaluation after doing therapy without resolution. Podiatrist did not change any medications. Patient came to the emergency room for evaluation.  Patient states that when walking long distances his calves go numb. When he sits to rest the feeling comes back. Patient states that when he goes to the bathroom to urinate he has to stand over the commode because the urine comes out with low pressure. He wakes up frequently at night to urinate. Denies history of kidney issues.  ED course: Patient started on vancomycin and Zosyn for cellulitis hospitalists consulted for admission.   Review of Systems:  As per HPI otherwise 10 point review of systems negative.    Past Medical History:  Diagnosis Date  . Diabetes mellitus   . Hyperlipemia   . Hypertension   . Stroke [facility])    History reviewed. No pertinent surgical history. Social History:  reports that he has quit smoking. He has quit using smokeless tobacco. He reports that he drinks alcohol. He reports that he does not use drugs.  Allergies  Allergen Reactions  . Shellfish Allergy     Family History  Problem Relation Age of Onset  . Stroke Father   . Hypertension Sister      Prior to  Admission medications   Medication Sig Start Date End Date Taking? Authorizing Provider  aspirin EC 325 MG tablet Take 325 mg by mouth daily.    Historical Provider, MD  canagliflozin (INVOKANA) 300 MG TABS tablet Take 300 mg by mouth daily before breakfast.    Historical Provider, MD  cholecalciferol (VITAMIN D) 1000 UNITS tablet Take 1,000 Units by mouth daily.    Historical Provider, MD  lisinopril-hydrochlorothiazide (PRINZIDE,ZESTORETIC) 10-12.5 MG per tablet Take 1 tablet by mouth every other day.    Historical Provider, MD  metFORMIN (GLUCOPHAGE) 500 MG tablet Take 500 mg by mouth 2 (two) times daily with a meal.    Historical Provider, MD  simvastatin (ZOCOR) 40 MG tablet Take 40 mg by mouth at bedtime.    Historical Provider, MD   Physical Exam: Vitals:   04/03/16 1015 04/03/16 1057 04/03/16 1130 04/03/16 1220  BP: 125/71 131/84 128/84 130/79  Pulse: 82 81 85 83  Resp: 18 16 16 14   Temp:      TempSrc:      SpO2: 96% 95% 96% 96%  Weight:      Height:        Wt Readings from Last 3 Encounters:  04/03/16 99.3 kg (219 lb)  04/24/15 98.9 kg (218 lb)  04/17/15 102 kg (224 lb 12.8 oz)    General:  Appears calm and comfortable; A&Ox3 Eyes:  PERRL, EOMI, normal lids, iris ENT:  grossly normal hearing, lips & tongue Neck:  no LAD, masses or thyromegaly Cardiovascular:  RRR, no  m/r/g. No LE edema.  Respiratory:  CTA bilaterally, no w/r/r. Normal respiratory effort. Abdomen:  soft, ntnd Skin:  Left foot with induration to distally at base of toes. 1st digit swollen and tender on L Musculoskeletal:  grossly normal tone BUE/BLE Psychiatric:  grossly normal mood and affect, speech fluent and appropriate Neurologic:  CN 2-12 grossly intact, moves all extremities in coordinated fashion. Rectal: Normal sphincter tone, normal anal wink, no masses, stool in rectal vault, normal prostate           Labs on Admission:  Basic Metabolic Panel:  Recent Labs Lab 04/03/16 1015  NA 136    K 4.3  CL 104  CO2 25  GLUCOSE 331*  BUN 23*  CREATININE 1.52*  CALCIUM 9.2   Liver Function Tests: No results for input(s): AST, ALT, ALKPHOS, BILITOT, PROT, ALBUMIN in the last 168 hours. No results for input(s): LIPASE, AMYLASE in the last 168 hours. No results for input(s): AMMONIA in the last 168 hours. CBC:  Recent Labs Lab 04/03/16 1015  WBC 7.5  NEUTROABS 5.4  HGB 14.0  HCT 41.0  MCV 83.8  PLT 161   Cardiac Enzymes: No results for input(s): CKTOTAL, CKMB, CKMBINDEX, TROPONINI in the last 168 hours.  BNP (last 3 results) No results for input(s): BNP in the last 8760 hours.  ProBNP (last 3 results) No results for input(s): PROBNP in the last 8760 hours.   Serum creatinine: 1.52 mg/dL High 62/95/2799/10/13 [id number] Estimated creatinine clearance: 62.1 mL/min  CBG: No results for input(s): GLUCAP in the last 168 hours.  Radiological Exams on Admission: Dg Foot Complete Left  Result Date: 04/03/2016 CLINICAL DATA:  Left first toe swelling EXAM: LEFT FOOT - COMPLETE 3+ VIEW COMPARISON:  None. FINDINGS: Mild soft tissue swelling of the first toe is noted. No acute fracture or dislocation is seen. No bony erosion is noted. Mild vascular calcifications are seen. IMPRESSION: No acute bony abnormality noted. Electronically Signed   By: Alcide CleverMark  Lukens M.D.   On: 04/03/2016 10:44    I independently reviewed the foot x-ray and agree with the impression of the radiologist. Findings include soft tissue swelling.   EKG: ordered  Assessment/Plan Principal Problem:   Cellulitis Active Problems:   Diabetes (HCC)   Hyperlipidemia with target low density lipoprotein (LDL) cholesterol less than 70 mg/dL   Essential hypertension, benign   Cerebrovascular disease   AKI (acute kidney injury) (HCC)  Cellulitis Continue vancomycin and Zosyn Patient admitted for outpatient treatment failure Will try to transition him to oral medication after some improvement presentation, likely due to  profound vascular disease Doppler revealed triphasic pulse DP PT bilaterally, poor blood flow likely delaying healing Patient is unsure of antibiotic he was on prior to arrival, will check outside records  AKI Patient denies past history of chronic kidney disease Creatinine 1.52 on admission Follow I&Os Magnesium phosphorus ordered UA ordered Checking fractional excretion of urea Kidney ultrasound ordered, will consider CT urogram in the future Avoid nephro toxic drugs Gentle IV hydration Based on history patient likely has CKD due to long-standing urinary obstruction.retention  Symptoms of BPH Starting patient on Flomax Check post for residual  Peripheral vascular disease Will need follow-up as outpatient for this issue.  Diabetes Sliding scale insulin Hold home medications  HLD Continue statin  Hypertension When necessary hydralazine 10 mg IV as needed for severe blood pressure Hold home antihypertensives  Code Status: FULL  DVT Prophylaxis: Lovenox Family Communication: GF at bedside, pt will contatc dgtr  who will be his surrogate decision maker Disposition Plan: Pending Improvement  Status: obs, med-surg  Haydee Salter, MD Family Medicine [facility] [email or web address] Password [email or web address]

## 2016-04-03 NOTE — Progress Notes (Signed)
Texted Dr. Melynda RippleHobbs about adding CBGs and sliding scale insulin to orders.

## 2016-04-03 NOTE — Progress Notes (Signed)
New pt admission from ED. Pt brought to the floor in stable condition. Vitals taken. Initial Assessment done. Pt great L toenail removed and foot red and painful to touch. All immediate pertinent needs to patient addressed. Patient Guide given to patient. Important safety instructions relating to hospitalization reviewed with patient. Patient verbalized understanding. Will continue to monitor pt.  Jilda PandaBethany Carolyn Sylvia RN

## 2016-04-03 NOTE — ED Provider Notes (Signed)
[facility]-EMERGENCY DEPT Provider Note   CSN: [id number] Arrival date & time: 04/03/16  7829     History   Chief Complaint Chief Complaint  Patient presents with  . Post-op Problem  . Toe Pain    HPI Jason Benton is a 61 y.o. male.  HPI Jason Benton is a 61 y.o. male with history of diabetes, hypertension, hyperlipidemia, CVA, presents to emergency department complaining of pain and swelling to the left big toe. Patient states that he had an ingrown toenail which was removed by a podiatrist one month ago. He has had multiple follow-ups since then, last seen 2 days ago. He states since the toenail removal he has developed swelling and redness to the toe. He also states he had a "blister" that has developed on the bottom of the toe which was debrided by this podiatrist a few weeks ago as well. She reports that when he was seen 2 days ago he was told he had poor circulation in his feet and he was going to be referred to a vascular specialist. He states that in the last 2 days since his appointment with the pain has gotten worse. He reports pain is better when he is actually standing on his feet but worse when he is sitting down or laying down. He reports some numbness to bilateral feet. Denies any fever or chills. He states he was on antibiotic and finished it last week. He was not restarted on any antibiotics 2 days ago. Denies any fever or chills.  Past Medical History:  Diagnosis Date  . Diabetes mellitus   . Hyperlipemia   . Hypertension   . Stroke [facility])     Patient Active Problem List   Diagnosis Date Noted  . Type II or unspecified type diabetes mellitus with unspecified complication, uncontrolled 06/27/2013  . Hyperlipidemia LDL goal < 70 06/27/2013  . Essential hypertension, benign 06/27/2013  . Cerebrovascular disease 06/27/2013    History reviewed. No pertinent surgical history.     Home Medications    Prior to Admission medications   Medication Sig Start Date End Date  Taking? Authorizing Provider  aspirin EC 325 MG tablet Take 325 mg by mouth daily.    Historical Provider, MD  canagliflozin (INVOKANA) 300 MG TABS tablet Take 300 mg by mouth daily before breakfast.    Historical Provider, MD  cholecalciferol (VITAMIN D) 1000 UNITS tablet Take 1,000 Units by mouth daily.    Historical Provider, MD  lisinopril-hydrochlorothiazide (PRINZIDE,ZESTORETIC) 10-12.5 MG per tablet Take 1 tablet by mouth every other day.    Historical Provider, MD  metFORMIN (GLUCOPHAGE) 500 MG tablet Take 500 mg by mouth 2 (two) times daily with a meal.    Historical Provider, MD  simvastatin (ZOCOR) 40 MG tablet Take 40 mg by mouth at bedtime.    Historical Provider, MD    Family History Family History  Problem Relation Age of Onset  . Stroke Father   . Hypertension Sister     Social History Social History  Substance Use Topics  . Smoking status: Former Games developer  . Smokeless tobacco: Former Neurosurgeon  . Alcohol use Yes     Allergies   Shellfish allergy   Review of Systems Review of Systems  Constitutional: Negative for chills and fever.  Respiratory: Negative for cough, chest tightness and shortness of breath.   Cardiovascular: Negative for chest pain, palpitations and leg swelling.  Gastrointestinal: Negative for abdominal distention, abdominal pain, diarrhea, nausea and vomiting.  Genitourinary: Negative for dysuria,  frequency, hematuria and urgency.  Musculoskeletal: Positive for arthralgias and myalgias. Negative for neck pain and neck stiffness.  Skin: Positive for wound. Negative for rash.  Allergic/Immunologic: Negative for immunocompromised state.  Neurological: Negative for dizziness, weakness, light-headedness, numbness and headaches.  All other systems reviewed and are negative.    Physical Exam Updated Vital Signs BP 130/93 (BP Location: Left Arm)   Pulse 95   Temp 98.1 F (36.7 C) (Oral)   Resp 17   Ht 5\' 11"  (1.803 m)   Wt 99.3 kg   SpO2 95%    BMI 30.54 kg/m   Physical Exam  Constitutional: He appears well-developed and well-nourished. No distress.  HENT:  Head: Normocephalic and atraumatic.  Eyes: Conjunctivae are normal.  Neck: Neck supple.  Cardiovascular: Normal rate, regular rhythm and normal heart sounds.   Pulmonary/Chest: Effort normal. No respiratory distress. He has no wheezes. He has no rales.  Abdominal: Soft. Bowel sounds are normal. He exhibits no distension. There is no tenderness. There is no rebound.  Musculoskeletal: He exhibits no edema.  Left great toe is erythematous, swollen. Erythema extends into the distal foot and there is a streak going up to the dorsal foot. Toe and dorsal foot tender to palpation. There is a new nail growing. There is a discolored callus to the medial aspect of the toe with surrounding swelling and tenderness. No drainage. Dorsal pedal pulses and posterior tibial pulses are palpated with the pencil Doppler. Dorsal pedal pulses are faint. Cap refill <2 sec  Neurological: He is alert.  Skin: Skin is warm and dry.  Nursing note and vitals reviewed.          ED Treatments / Results  Labs (all labs ordered are listed, but only abnormal results are displayed) Labs Reviewed  CBC WITH DIFFERENTIAL/PLATELET  BASIC METABOLIC PANEL    EKG  EKG Interpretation None       Radiology No results found.  Procedures Procedures (including critical care time)  Medications Ordered in ED Medications - No data to display   Initial Impression / Assessment and Plan / ED Course  I have reviewed the triage vital signs and the nursing notes.  Pertinent labs & imaging results that were available during my care of the patient were reviewed by me and considered in my medical decision making (see chart for details).  Clinical Course    Pt in ED with left toe cellulitis. Pt afebrile, non toxic appearing will check labs and xray.   Pt's xray negative. Labs unremarkable other than  glucose of 331, pt states he just ate breakfast. Creat 1.5, no prior.  Discussed with Dr. Freida Busman. Insetting of diabetes, poor circulation to that foot, nonhealing infection to the toe with a recent antibiotics, will start on IV antibiotics and admit for cellulitis.  Spoke with hospitalist will admit. Started on vanc and zosyn   Vitals:   04/03/16 1015 04/03/16 1057 04/03/16 1130 04/03/16 1220  BP: 125/71 131/84 128/84 130/79  Pulse: 82 81 85 83  Resp: 18 16 16 14   Temp:      TempSrc:      SpO2: 96% 95% 96% 96%  Weight:      Height:         Final Clinical Impressions(s) / ED Diagnoses   Final diagnoses:  Cellulitis and abscess of toe of left foot  AKI (acute kidney injury) (HCC)    New Prescriptions New Prescriptions   No medications on file     Jaynie Crumble,  PA-C 04/03/16 1248    Lorre NickAnthony Allen, MD 04/05/16 [phone]

## 2016-04-03 NOTE — ED Notes (Signed)
Pt returned from xray

## 2016-04-03 NOTE — ED Triage Notes (Signed)
Pt. Stated, I had toe surgery on Decf. 1 , it was to take a toenail off and its not healed at all and its so painful

## 2016-04-03 NOTE — ED Notes (Signed)
Attempted report 

## 2016-04-03 NOTE — Progress Notes (Signed)
Pharmacy Antibiotic Note  Jason LedgerJerry Benton is a 61 y.o. male admitted on 04/03/2016 with toe pain and swelling.  Pharmacy has been consulted for vancomycin and Zosyn dosing for cellulitis. Recent big toe nail removal by podiatrist then developed a blister on the bottom of that toe that was debrided by podiatrist. He recently completed a course of doxycycline. SCr is elevated at 1.52 with no recent labs to compare.  Plan: Vancomycin 1000 mg IV every 12 hours.  Goal trough 10-15 mcg/mL.  Zosyn 3.375 g IV q8h to be infused over 4 hours Monitor renal function, clinical progress, and LOT Vancomycin trough as clinically indicated   Height: 5\' 11"  (180.3 cm) Weight: 219 lb (99.3 kg) IBW/kg (Calculated) : 75.3  Temp (24hrs), Avg:98.1 F (36.7 C), Min:98.1 F (36.7 C), Max:98.1 F (36.7 C)   Recent Labs Lab 04/03/16 1015  WBC 7.5  CREATININE 1.52*    Estimated Creatinine Clearance: 62.1 mL/min (by C-G formula based on SCr of 1.52 mg/dL (H)).    Allergies  Allergen Reactions  . Shellfish Allergy     Antimicrobials this admission: Vancomycin 1/7 >>  Zosyn 1/7 >>   Dose adjustments this admission:   Microbiology results:   Thank you for allowing pharmacy to be a part of this patient's care.  Loura BackJennifer Severn, PharmD, BCPS Clinical Pharmacist Phone for today [phone]- [phone] Main pharmacy - [phone] 04/03/2016 12:08 PM

## 2016-04-03 NOTE — ED Notes (Signed)
Pt to US.

## 2016-04-03 NOTE — ED Notes (Signed)
Patient transported to Ultrasound 

## 2016-04-04 DIAGNOSIS — M869 Osteomyelitis, unspecified: Secondary | ICD-10-CM | POA: Diagnosis not present

## 2016-04-04 DIAGNOSIS — I129 Hypertensive chronic kidney disease with stage 1 through stage 4 chronic kidney disease, or unspecified chronic kidney disease: Secondary | ICD-10-CM | POA: Diagnosis present

## 2016-04-04 DIAGNOSIS — E1121 Type 2 diabetes mellitus with diabetic nephropathy: Secondary | ICD-10-CM | POA: Diagnosis present

## 2016-04-04 DIAGNOSIS — N182 Chronic kidney disease, stage 2 (mild): Secondary | ICD-10-CM | POA: Diagnosis present

## 2016-04-04 DIAGNOSIS — I10 Essential (primary) hypertension: Secondary | ICD-10-CM | POA: Diagnosis not present

## 2016-04-04 DIAGNOSIS — L02612 Cutaneous abscess of left foot: Secondary | ICD-10-CM | POA: Diagnosis not present

## 2016-04-04 DIAGNOSIS — E785 Hyperlipidemia, unspecified: Secondary | ICD-10-CM | POA: Diagnosis not present

## 2016-04-04 DIAGNOSIS — Z8249 Family history of ischemic heart disease and other diseases of the circulatory system: Secondary | ICD-10-CM | POA: Diagnosis not present

## 2016-04-04 DIAGNOSIS — Z91013 Allergy to seafood: Secondary | ICD-10-CM | POA: Diagnosis not present

## 2016-04-04 DIAGNOSIS — Z7982 Long term (current) use of aspirin: Secondary | ICD-10-CM | POA: Diagnosis not present

## 2016-04-04 DIAGNOSIS — N179 Acute kidney failure, unspecified: Secondary | ICD-10-CM | POA: Diagnosis not present

## 2016-04-04 DIAGNOSIS — E1169 Type 2 diabetes mellitus with other specified complication: Secondary | ICD-10-CM | POA: Diagnosis present

## 2016-04-04 DIAGNOSIS — M86672 Other chronic osteomyelitis, left ankle and foot: Secondary | ICD-10-CM | POA: Diagnosis present

## 2016-04-04 DIAGNOSIS — Z8673 Personal history of transient ischemic attack (TIA), and cerebral infarction without residual deficits: Secondary | ICD-10-CM | POA: Diagnosis not present

## 2016-04-04 DIAGNOSIS — Z7984 Long term (current) use of oral hypoglycemic drugs: Secondary | ICD-10-CM | POA: Diagnosis not present

## 2016-04-04 DIAGNOSIS — M25572 Pain in left ankle and joints of left foot: Secondary | ICD-10-CM | POA: Diagnosis present

## 2016-04-04 DIAGNOSIS — G8918 Other acute postprocedural pain: Secondary | ICD-10-CM | POA: Diagnosis not present

## 2016-04-04 DIAGNOSIS — L03032 Cellulitis of left toe: Secondary | ICD-10-CM

## 2016-04-04 DIAGNOSIS — Z87891 Personal history of nicotine dependence: Secondary | ICD-10-CM | POA: Diagnosis not present

## 2016-04-04 DIAGNOSIS — N400 Enlarged prostate without lower urinary tract symptoms: Secondary | ICD-10-CM | POA: Diagnosis present

## 2016-04-04 DIAGNOSIS — E1142 Type 2 diabetes mellitus with diabetic polyneuropathy: Secondary | ICD-10-CM | POA: Diagnosis present

## 2016-04-04 DIAGNOSIS — E1122 Type 2 diabetes mellitus with diabetic chronic kidney disease: Secondary | ICD-10-CM | POA: Diagnosis present

## 2016-04-04 DIAGNOSIS — L03116 Cellulitis of left lower limb: Secondary | ICD-10-CM

## 2016-04-04 DIAGNOSIS — E1151 Type 2 diabetes mellitus with diabetic peripheral angiopathy without gangrene: Secondary | ICD-10-CM | POA: Diagnosis not present

## 2016-04-04 DIAGNOSIS — L0390 Cellulitis, unspecified: Secondary | ICD-10-CM | POA: Diagnosis not present

## 2016-04-04 DIAGNOSIS — R031 Nonspecific low blood-pressure reading: Secondary | ICD-10-CM | POA: Diagnosis present

## 2016-04-04 DIAGNOSIS — E119 Type 2 diabetes mellitus without complications: Secondary | ICD-10-CM | POA: Diagnosis not present

## 2016-04-04 DIAGNOSIS — E1165 Type 2 diabetes mellitus with hyperglycemia: Secondary | ICD-10-CM | POA: Diagnosis present

## 2016-04-04 DIAGNOSIS — Z823 Family history of stroke: Secondary | ICD-10-CM | POA: Diagnosis not present

## 2016-04-04 DIAGNOSIS — E11621 Type 2 diabetes mellitus with foot ulcer: Secondary | ICD-10-CM | POA: Diagnosis present

## 2016-04-04 LAB — BASIC METABOLIC PANEL
Anion gap: 6 (ref 5–15)
BUN: 20 mg/dL (ref 6–20)
CALCIUM: 8.7 mg/dL — AB (ref 8.9–10.3)
CO2: 27 mmol/L (ref 22–32)
CREATININE: 1.7 mg/dL — AB (ref 0.61–1.24)
Chloride: 103 mmol/L (ref 101–111)
GFR calc non Af Amer: 42 mL/min — ABNORMAL LOW (ref >60)
GFR, EST AFRICAN AMERICAN: 49 mL/min — AB (ref >60)
Glucose, Bld: 187 mg/dL — ABNORMAL HIGH (ref 65–99)
Potassium: 4 mmol/L (ref 3.5–5.1)
SODIUM: 136 mmol/L (ref 135–145)

## 2016-04-04 LAB — GLUCOSE, CAPILLARY
GLUCOSE-CAPILLARY: 149 mg/dL — AB (ref 65–99)
GLUCOSE-CAPILLARY: 127 mg/dL — AB (ref 65–99)
Glucose-Capillary: 221 mg/dL — ABNORMAL HIGH (ref 65–99)
Glucose-Capillary: 168 mg/dL — ABNORMAL HIGH (ref 65–99)
Glucose-Capillary: 174 mg/dL — ABNORMAL HIGH (ref 65–99)

## 2016-04-04 LAB — CBC
HCT: 37.8 % — ABNORMAL LOW (ref 39.0–52.0)
Hemoglobin: 12.4 g/dL — ABNORMAL LOW (ref 13.0–17.0)
MCH: 27.8 pg (ref 26.0–34.0)
MCHC: 32.8 g/dL (ref 30.0–36.0)
MCV: 84.8 fL (ref 78.0–100.0)
PLATELETS: 154 10*3/uL (ref 150–400)
RBC: 4.46 MIL/uL (ref 4.22–5.81)
RDW: 12.4 % (ref 11.5–15.5)
WBC: 7.5 10*3/uL (ref 4.0–10.5)

## 2016-04-04 LAB — UREA NITROGEN, URINE: Urea Nitrogen, Ur: 564 mg/dL

## 2016-04-04 LAB — URIC ACID: URIC ACID, SERUM: 4.4 mg/dL (ref 4.4–7.6)

## 2016-04-04 MED ORDER — OXYCODONE-ACETAMINOPHEN 5-325 MG PO TABS
1.0000 | ORAL_TABLET | ORAL | Status: DC | PRN
  Administered 2016-04-04 – 2016-04-06 (×11): 2 via ORAL
  Administered 2016-04-06: 1 via ORAL
  Administered 2016-04-06 – 2016-04-09 (×14): 2 via ORAL
  Filled 2016-04-04 (×26): qty 2

## 2016-04-04 MED ORDER — INSULIN GLARGINE 100 UNIT/ML ~~LOC~~ SOLN
10.0000 [IU] | Freq: Every day | SUBCUTANEOUS | Status: DC
  Administered 2016-04-04 – 2016-04-09 (×5): 10 [IU] via SUBCUTANEOUS
  Filled 2016-04-04 (×6): qty 0.1

## 2016-04-04 MED ORDER — VANCOMYCIN HCL 10 G IV SOLR
1500.0000 mg | INTRAVENOUS | Status: DC
  Administered 2016-04-04 – 2016-04-07 (×4): 1500 mg via INTRAVENOUS
  Filled 2016-04-04 (×6): qty 1500

## 2016-04-04 MED ORDER — OXYCODONE-ACETAMINOPHEN 5-325 MG PO TABS
2.0000 | ORAL_TABLET | Freq: Once | ORAL | Status: AC
  Administered 2016-04-04: 2 via ORAL
  Filled 2016-04-04: qty 2

## 2016-04-04 NOTE — Consult Note (Signed)
ORTHOPAEDIC CONSULTATION  REQUESTING PHYSICIAN: Jason Art, DO  Chief Complaint: Several week history of ulcer and cellulitis left great toe  HPI: Jason Benton is a 60 y.o. male who presents with diabetic insensate neuropathy with peripheral vascular disease hypertension status post stroke with several week history of ulceration cellulitis and pain left great toe  Past Medical History:  Diagnosis Date  . Diabetes mellitus   . Hyperlipemia   . Hypertension   . Stroke [facility])    History reviewed. No pertinent surgical history. Social History   Social History  . Marital status: Single    Spouse name: N/A  . Number of children: N/A  . Years of education: N/A   Social History Main Topics  . Smoking status: Former Games developer  . Smokeless tobacco: Former Neurosurgeon  . Alcohol use Yes  . Drug use: No  . Sexual activity: Yes    Partners: Female   Other Topics Concern  . None   Social History Narrative  . None   Family History  Problem Relation Age of Onset  . Stroke Father   . Hypertension Sister    - negative except otherwise stated in the family history section Allergies  Allergen Reactions  . Shellfish Allergy    Prior to Admission medications   Medication Sig Start Date End Date Taking? Authorizing Provider  aspirin EC 325 MG tablet Take 325 mg by mouth daily.   Yes Historical Provider, MD  canagliflozin (INVOKANA) 300 MG TABS tablet Take 300 mg by mouth daily before breakfast.   Yes Historical Provider, MD  cholecalciferol (VITAMIN D) 1000 UNITS tablet Take 1,000 Units by mouth daily.   Yes Historical Provider, MD  lisinopril-hydrochlorothiazide (PRINZIDE,ZESTORETIC) 10-12.5 MG per tablet Take 1 tablet by mouth daily.    Yes Historical Provider, MD  metFORMIN (GLUCOPHAGE) 500 MG tablet Take 500 mg by mouth 2 (two) times daily with a meal.   Yes Historical Provider, MD  simvastatin (ZOCOR) 40 MG tablet Take 40 mg by mouth daily.    Yes Historical Provider, MD   US  Renal  Result Date: 04/03/2016 CLINICAL DATA:  Acute renal injury EXAM: RENAL / URINARY TRACT ULTRASOUND COMPLETE COMPARISON:  None. FINDINGS: Right Kidney: Length: 8.7 cm. Echogenicity within normal limits. No mass or hydronephrosis visualized. Left Kidney: Length: 9.7 cm. Echogenicity within normal limits. No mass or hydronephrosis visualized. Bladder: Appears normal for degree of bladder distention. IMPRESSION: No acute abnormality noted. Electronically Signed   By: Alcide Clever M.D.   On: 04/03/2016 13:48   Dg Foot Complete Left  Result Date: 04/03/2016 CLINICAL DATA:  Left first toe swelling EXAM: LEFT FOOT - COMPLETE 3+ VIEW COMPARISON:  None. FINDINGS: Mild soft tissue swelling of the first toe is noted. No acute fracture or dislocation is seen. No bony erosion is noted. Mild vascular calcifications are seen. IMPRESSION: No acute bony abnormality noted. Electronically Signed   By: Alcide Clever M.D.   On: 04/03/2016 10:44   - pertinent xrays, CT, MRI studies were reviewed and independently interpreted  Positive ROS: All other systems have been reviewed and were otherwise negative with the exception of those mentioned in the HPI and as above.  Physical Exam: General: Alert, no acute distress Psychiatric: Patient is competent for consent with normal mood and affect Lymphatic: No axillary or cervical lymphadenopathy Cardiovascular: No pedal edema Respiratory: No cyanosis, no use of accessory musculature GI: No organomegaly, abdomen is soft and non-tender  Skin: Patient has an ischemic ulcer  over the plantar medial aspect of the left great toe IP joint. There is cellulitis around the MTP joint with red streaking going up to the midfoot. There is no purulent drainage.   Neurologic: Patient does not have protective sensation bilateral lower extremities.   MUSCULOSKELETAL:  Examination patient is alert oriented no adenopathy. He does not a palpable dorsalis pedis or posterior tibial pulse by  report he did have a dopplerable pulse. Patient has an ischemic ulcer which is approximately 10 mm in diameter 1 mm deep with black eschar. There is no drainage. Review of the radiographs shows a lytic lesion of the distal phalanx of the left great toe at the level of the ulcer consistent with possible osteomyelitis.  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  Plan: Plan: We will obtain ankle-brachial indices as well as uric acid level. Discussed with the patient that we will follow this closely and see if salvage of his toe is possible.  Thank you for the consult and the opportunity to see Mr. Jason DuosCrump  [name], MD [facility] [phone] 5:55 PM

## 2016-04-04 NOTE — Progress Notes (Signed)
Pharmacy Antibiotic Note  Jason LedgerJerry Benton is a 61 y.o. male admitted on 04/03/2016 with toe pain and swelling.  Pharmacy has been consulted for vancomycin and Zosyn dosing for cellulitis. Recent big toe nail removal by podiatrist then developed a blister on the bottom of that toe that was debrided by podiatrist. He recently completed a course of doxycycline. SCr 1.52 >> 1.7, normalized creat cl 47 ml/min. WBC WNL, AF.   Plan: Adjust vancomycin to 1500 mg IV q24 for trough goal 10 - 15 mcg/ml  Continue Zosyn 3.375 g IV q8h to be infused over 4 hours Monitor renal function, clinical progress, and LOT Vancomycin trough as clinically indicated   Height: 5\' 11"  (180.3 cm) Weight: 219 lb (99.3 kg) IBW/kg (Calculated) : 75.3  Temp (24hrs), Avg:98.5 F (36.9 C), Min:98.3 F (36.8 C), Max:98.7 F (37.1 C)   Recent Labs Lab 04/03/16 1015 04/04/16 0402  WBC 7.5 7.5  CREATININE 1.52* 1.70*    Estimated Creatinine Clearance: 55.5 mL/min (by C-G formula based on SCr of 1.7 mg/dL (H)).    Allergies  Allergen Reactions  . Shellfish Allergy     Antimicrobials this admission: Vancomycin 1/7 >>  Zosyn 1/7 >>   Dose adjustments this admission: vanc dose adjusted 1/8  Microbiology results: none  Thank you for allowing pharmacy to be a part of this patient's care.  Jason AbrahamMichelle T. Jakyra Benton, Pharm.D. [phone] 04/04/2016 12:44 PM

## 2016-04-04 NOTE — Progress Notes (Signed)
PROGRESS NOTE    Jason Benton  ZOX:[id number] DOB: 18-Nov-1955 DOA: 04/03/2016 PCP: Pearla Dubonnet, MD   Outpatient Specialists:     Brief Narrative:  Dale Ribeiro is a 61 y.o. male  with past mental history of stroke, diabetes, hypertension, hyperlipidemia presents emergency room with chief complaint of toe pain and swelling. Patient reports that he had left great toenail pain. Went to a podiatrist for evaluation and the toenails removed. This is approximately 2 weeks ago. Patient will follow-up as toe was red and looked a little infected. Patient was prescribed an antibiotic pill and cream. Patient took the pill as directed. The cream was to come to order never arrived. Patient went for evaluation after doing therapy without resolution. Podiatrist did not change any medications. Patient came to the emergency room for evaluation.  Patient states that when walking long distances his calves go numb. When he sits to rest the feeling comes back. Patient states that when he goes to the bathroom to urinate he has to stand over the commode because the urine comes out with low pressure. He wakes up frequently at night to urinate. Denies history of kidney issues.  ED course: Patient started on vancomycin and Zosyn for cellulitis hospitalists consulted for admission.   Assessment & Plan:   Principal Problem:   Cellulitis Active Problems:   Diabetes (HCC)   Hyperlipidemia with target low density lipoprotein (LDL) cholesterol less than 70 mg/dL   Essential hypertension, benign   Cerebrovascular disease   AKI (acute kidney injury) (HCC)   Cellulitis Continue vancomycin and Zosyn -outpatient treatment failure -ABIs-- pulses only found with doppler poor blood flow likely delaying healing   AKI Patient denies past history of chronic kidney disease Creatinine 1.52 on admission Follow I&Os Avoid nephro toxic drugs Gentle IV hydration Based on history patient likely has CKD   Symptoms  of BPH Starting patient on Flomax  Diabetes Sliding scale insulin -start lantus while inpatient -HgbA1C in process  HLD Continue statin  Hypertension- BP low currently When necessary hydralazine 10 mg IV as needed for severe blood pressure Hold home antihypertensives    DVT prophylaxis:  Lovenox   Code Status: Full Code   Family Communication:   Disposition Plan:     Consultants:        Subjective: Asking about going home-- says his foot is worse  Objective: Vitals:   04/03/16 1406 04/03/16 2137 04/04/16 0445 04/04/16 1418  BP: 108/63 (!) 103/53 115/71 108/71  Pulse: 80 93 90 89  Resp: 18 18 18    Temp: 98.7 F (37.1 C) 98.4 F (36.9 C) 98.3 F (36.8 C) 98.5 F (36.9 C)  TempSrc: Oral Oral Oral Oral  SpO2: 97% 97% 94% 96%  Weight:      Height:        Intake/Output Summary (Last 24 hours) at 04/04/16 1442 Last data filed at 04/04/16 1300  Gross per 24 hour  Intake             2819 ml  Output             1450 ml  Net             1369 ml   Filed Weights   04/03/16 0935  Weight: 99.3 kg (219 lb)    Examination:  General exam: Appears calm and comfortable  Respiratory system: Clear to auscultation. Respiratory effort normal. Cardiovascular system: S1 & S2 heard, RRR. No JVD, murmurs, rubs, gallops or clicks. No pedal edema. Gastrointestinal  system: Abdomen is nondistended, soft and nontender. No organomegaly or masses felt. Normal bowel sounds heard. Skin: redness streaking up to midfoot, hard ulcer on outside of big toe-- toenail 1/3 of nail bed   Data Reviewed: I have personally reviewed following labs and imaging studies  CBC:  Recent Labs Lab 04/03/16 1015 04/04/16 0402  WBC 7.5 7.5  NEUTROABS 5.4  --   HGB 14.0 12.4*  HCT 41.0 37.8*  MCV 83.8 84.8  PLT 161 154   Basic Metabolic Panel:  Recent Labs Lab 04/03/16 1015 04/03/16 1230 04/04/16 0402  NA 136  --  136  K 4.3  --  4.0  CL 104  --  103  CO2 25  --  27    GLUCOSE 331*  --  187*  BUN 23*  --  20  CREATININE 1.52*  --  1.70*  CALCIUM 9.2  --  8.7*  MG  --  2.2  --   PHOS  --  2.4*  --    GFR: Estimated Creatinine Clearance: 55.5 mL/min (by C-G formula based on SCr of 1.7 mg/dL (H)). Liver Function Tests: No results for input(s): AST, ALT, ALKPHOS, BILITOT, PROT, ALBUMIN in the last 168 hours. No results for input(s): LIPASE, AMYLASE in the last 168 hours. No results for input(s): AMMONIA in the last 168 hours. Coagulation Profile: No results for input(s): INR, PROTIME in the last 168 hours. Cardiac Enzymes: No results for input(s): CKTOTAL, CKMB, CKMBINDEX, TROPONINI in the last 168 hours. BNP (last 3 results) No results for input(s): PROBNP in the last 8760 hours. HbA1C: No results for input(s): HGBA1C in the last 72 hours. CBG:  Recent Labs Lab 04/03/16 1720 04/03/16 2139 04/04/16 0806 04/04/16 1227 04/04/16 1422  GLUCAP 297* 202* 221* 149* 127*   Lipid Profile: No results for input(s): CHOL, HDL, LDLCALC, TRIG, CHOLHDL, LDLDIRECT in the last 72 hours. Thyroid Function Tests: No results for input(s): TSH, T4TOTAL, FREET4, T3FREE, THYROIDAB in the last 72 hours. Anemia Panel: No results for input(s): VITAMINB12, FOLATE, FERRITIN, TIBC, IRON, RETICCTPCT in the last 72 hours. Urine analysis:    Component Value Date/Time   COLORURINE YELLOW 04/03/2016 1308   APPEARANCEUR CLEAR 04/03/2016 1308   LABSPEC 1.017 04/03/2016 1308   PHURINE 7.0 04/03/2016 1308   GLUCOSEU >=500 (A) 04/03/2016 1308   HGBUR NEGATIVE 04/03/2016 1308   BILIRUBINUR NEGATIVE 04/03/2016 1308   KETONESUR NEGATIVE 04/03/2016 1308   PROTEINUR NEGATIVE 04/03/2016 1308   UROBILINOGEN 1.0 05/24/2011 1840   NITRITE NEGATIVE 04/03/2016 1308   LEUKOCYTESUR NEGATIVE 04/03/2016 1308     )No results found for this or any previous visit (from the past 240 hour(s)).    Anti-infectives    Start     Dose/Rate Route Frequency Ordered Stop   04/04/16 1800   vancomycin (VANCOCIN) 1,500 mg in sodium chloride 0.9 % 500 mL IVPB     1,500 mg 250 mL/hr over 120 Minutes Intravenous Every 24 hours 04/04/16 1243     04/03/16 1800  piperacillin-tazobactam (ZOSYN) IVPB 3.375 g     3.375 g 12.5 mL/hr over 240 Minutes Intravenous Every 8 hours 04/03/16 1242     04/03/16 1230  vancomycin (VANCOCIN) IVPB 1000 mg/200 mL premix  Status:  Discontinued     1,000 mg 200 mL/hr over 60 Minutes Intravenous Every 12 hours 04/03/16 1209 04/04/16 1243   04/03/16 1200  piperacillin-tazobactam (ZOSYN) IVPB 3.375 g     3.375 g 100 mL/hr over 30 Minutes Intravenous  Once  04/03/16 1154 04/03/16 1312       Radiology Studies: US Renal  Result Date: 04/03/2016 CLINICAL DATA:  Acute renal injury EXAM: RENAL / URINARY TRACT ULTRASOUND COMPLETE COMPARISON:  None. FINDINGS: Right Kidney: Length: 8.7 cm. Echogenicity within normal limits. No mass or hydronephrosis visualized. Left Kidney: Length: 9.7 cm. Echogenicity within normal limits. No mass or hydronephrosis visualized. Bladder: Appears normal for degree of bladder distention. IMPRESSION: No acute abnormality noted. Electronically Signed   By: Alcide Clever M.D.   On: 04/03/2016 13:48   Dg Foot Complete Left  Result Date: 04/03/2016 CLINICAL DATA:  Left first toe swelling EXAM: LEFT FOOT - COMPLETE 3+ VIEW COMPARISON:  None. FINDINGS: Mild soft tissue swelling of the first toe is noted. No acute fracture or dislocation is seen. No bony erosion is noted. Mild vascular calcifications are seen. IMPRESSION: No acute bony abnormality noted. Electronically Signed   By: Alcide Clever M.D.   On: 04/03/2016 10:44        Scheduled Meds: . acidophilus  1 capsule Oral Daily  . enoxaparin (LOVENOX) injection  0.5 mg/kg Subcutaneous Q24H  . insulin aspart  0-20 Units Subcutaneous TID WC  . insulin glargine  10 Units Subcutaneous Daily  . piperacillin-tazobactam (ZOSYN)  IV  3.375 g Intravenous Q8H  . simvastatin  40 mg Oral QHS  .  tamsulosin  0.4 mg Oral Daily  . vancomycin  1,500 mg Intravenous Q24H   Continuous Infusions: . sodium chloride 75 mL/hr at 04/03/16 1708     LOS: 0 days    Time spent: 35 min    Sakiya Stepka U Domenica Weightman, DO [facility] Pager [phone]  If 7PM-7AM, please contact night-coverage [email or web address] Password [email or web address] 04/04/2016, 2:42 PM

## 2016-04-05 ENCOUNTER — Inpatient Hospital Stay (HOSPITAL_COMMUNITY): Payer: BLUE CROSS/BLUE SHIELD

## 2016-04-05 DIAGNOSIS — E785 Hyperlipidemia, unspecified: Secondary | ICD-10-CM

## 2016-04-05 DIAGNOSIS — L0390 Cellulitis, unspecified: Secondary | ICD-10-CM

## 2016-04-05 LAB — CBC
HEMATOCRIT: 38 % — AB (ref 39.0–52.0)
HEMOGLOBIN: 12.6 g/dL — AB (ref 13.0–17.0)
MCH: 28.1 pg (ref 26.0–34.0)
MCHC: 33.2 g/dL (ref 30.0–36.0)
MCV: 84.6 fL (ref 78.0–100.0)
Platelets: 159 10*3/uL (ref 150–400)
RBC: 4.49 MIL/uL (ref 4.22–5.81)
RDW: 12 % (ref 11.5–15.5)
WBC: 7.4 10*3/uL (ref 4.0–10.5)

## 2016-04-05 LAB — GLUCOSE, CAPILLARY
GLUCOSE-CAPILLARY: 128 mg/dL — AB (ref 65–99)
Glucose-Capillary: 123 mg/dL — ABNORMAL HIGH (ref 65–99)
Glucose-Capillary: 171 mg/dL — ABNORMAL HIGH (ref 65–99)
Glucose-Capillary: 123 mg/dL — ABNORMAL HIGH (ref 65–99)

## 2016-04-05 LAB — HEMOGLOBIN A1C
Hgb A1c MFr Bld: 8.6 % — ABNORMAL HIGH (ref 4.8–5.6)
MEAN PLASMA GLUCOSE: 200 mg/dL

## 2016-04-05 LAB — BASIC METABOLIC PANEL
Anion gap: 8 (ref 5–15)
BUN: 14 mg/dL (ref 6–20)
CHLORIDE: 103 mmol/L (ref 101–111)
CO2: 26 mmol/L (ref 22–32)
CREATININE: 1.63 mg/dL — AB (ref 0.61–1.24)
Calcium: 8.8 mg/dL — ABNORMAL LOW (ref 8.9–10.3)
GFR calc Af Amer: 51 mL/min — ABNORMAL LOW (ref >60)
GFR calc non Af Amer: 44 mL/min — ABNORMAL LOW (ref >60)
Glucose, Bld: 110 mg/dL — ABNORMAL HIGH (ref 65–99)
Potassium: 4.1 mmol/L (ref 3.5–5.1)
Sodium: 137 mmol/L (ref 135–145)

## 2016-04-05 NOTE — Progress Notes (Addendum)
VASCULAR LAB PRELIMINARY  ARTERIAL  ABI completed:    RIGHT    LEFT    PRESSURE WAVEFORM  PRESSURE WAVEFORM  BRACHIAL 141 Triphasic BRACHIAL 140 Triphasic  DP 126 Triphasic DP 102 Monophasic  PT 143 Triphasic PT 85 Biphasic  GREAT TOE >255 NA GREAT TOE 41 NA    RIGHT LEFT  ABI / TBI 1.01 / NA 0.72 / 0.29   Right ABI indicate normal arterial flow at rest. Great Toe TBI can not be determined due to non compressible arteries probably secondary to calcification. Left ABI and Doppler waveforms indicate a moderate  reduction in arterial flow at rest. The Great Toe TBI indicates abnormal arterial flow.  Jason Benton, RVS 04/05/2016, 12:29 PM

## 2016-04-05 NOTE — Progress Notes (Signed)
PROGRESS NOTE    Jason LedgerJerry Depinto  ZOX:[id number] DOB: 11/19/1955 DOA: 04/03/2016 PCP: Pearla DubonnetGATES,ROBERT NEVILL, MD   Outpatient Specialists:     Brief Narrative:  Jason LedgerJerry Benton is a 61 y.o. male  with past mental history of stroke, diabetes, hypertension, hyperlipidemia presents emergency room with chief complaint of toe pain and swelling. Patient reports that he had left great toenail pain. Went to a podiatrist for evaluation and the toenails removed. This is approximately 2 weeks ago. Patient will follow-up as toe was red and looked a little infected. Patient was prescribed an antibiotic pill and cream. Patient took the pill as directed. Patient states that when walking long distances his calves go numb. When he sits to rest the feeling comes back. ED course: Patient started on vancomycin and Zosyn for cellulitis hospitalists consulted for admission.   Assessment & Plan:   Principal Problem:   Cellulitis Active Problems:   Diabetes (HCC)   Hyperlipidemia with target low density lipoprotein (LDL) cholesterol less than 70 mg/dL   Essential hypertension, benign   Cerebrovascular disease   AKI (acute kidney injury) (HCC)   Cellulitis and abscess of toe of left foot   Cellulitis Continue vancomycin and Zosyn -outpatient treatment failure -ABIs pending--  poor blood flow likely delaying healing -Dr. Lajoyce Cornersuda consult  AKI-- most likely CKD stage III Patient denies past history of chronic kidney disease Creatinine 1.52 on admission Follow I&Os Avoid nephro toxic drugs Gentle IV hydration Based on history patient likely has CKD   Symptoms of BPH Starting patient on Flomax  Diabetes- poorly controlled Sliding scale insulin -start lantus while inpatient -HgbA1C: 8.6--- improved from 1 year ago at 10.4  HLD Continue statin  Hypertension- BP low currently When necessary hydralazine 10 mg IV as needed for severe blood pressure Hold home antihypertensives    DVT prophylaxis:    Lovenox   Code Status: Full Code   Family Communication:   Disposition Plan:     Consultants:        Subjective: Asking about going home-- says his foot is worse  Objective: Vitals:   04/04/16 0445 04/04/16 1418 04/04/16 2225 04/05/16 0618  BP: 115/71 108/71 135/73 112/73  Pulse: 90 89 97 81  Resp: 18  18 17   Temp: 98.3 F (36.8 C) 98.5 F (36.9 C) 99.1 F (37.3 C) 99 F (37.2 C)  TempSrc: Oral Oral Oral Oral  SpO2: 94% 96% 98% 94%  Weight:      Height:        Intake/Output Summary (Last 24 hours) at 04/05/16 1204 Last data filed at 04/05/16 0952  Gross per 24 hour  Intake             3467 ml  Output             2475 ml  Net              992 ml   Filed Weights   04/03/16 0935  Weight: 99.3 kg (219 lb)    Examination:  General exam: Appears calm and comfortable  Respiratory system: Clear to auscultation. Respiratory effort normal. Cardiovascular system: S1 & S2 heard, RRR. No JVD, murmurs, rubs, gallops or clicks. No pedal edema. Gastrointestinal system: Abdomen is nondistended, soft and nontender. No organomegaly or masses felt. Normal bowel sounds heard. Skin: redness streaking up to midfoot, hard ulcer on outside of big toe-- toenail 1/3 of nail bed-- pulses diminished   Data Reviewed: I have personally reviewed following labs and imaging studies  CBC:  Recent Labs Lab 04/03/16 1015 04/04/16 0402 04/05/16 0730  WBC 7.5 7.5 7.4  NEUTROABS 5.4  --   --   HGB 14.0 12.4* 12.6*  HCT 41.0 37.8* 38.0*  MCV 83.8 84.8 84.6  PLT 161 154 159   Basic Metabolic Panel:  Recent Labs Lab 04/03/16 1015 04/03/16 1230 04/04/16 0402 04/05/16 0730  NA 136  --  136 137  K 4.3  --  4.0 4.1  CL 104  --  103 103  CO2 25  --  27 26  GLUCOSE 331*  --  187* 110*  BUN 23*  --  20 14  CREATININE 1.52*  --  1.70* 1.63*  CALCIUM 9.2  --  8.7* 8.8*  MG  --  2.2  --   --   PHOS  --  2.4*  --   --    GFR: Estimated Creatinine Clearance: 57.9 mL/min  (by C-G formula based on SCr of 1.63 mg/dL (H)). Liver Function Tests: No results for input(s): AST, ALT, ALKPHOS, BILITOT, PROT, ALBUMIN in the last 168 hours. No results for input(s): LIPASE, AMYLASE in the last 168 hours. No results for input(s): AMMONIA in the last 168 hours. Coagulation Profile: No results for input(s): INR, PROTIME in the last 168 hours. Cardiac Enzymes: No results for input(s): CKTOTAL, CKMB, CKMBINDEX, TROPONINI in the last 168 hours. BNP (last 3 results) No results for input(s): PROBNP in the last 8760 hours. HbA1C:  Recent Labs  04/04/16 0402  HGBA1C 8.6*   CBG:  Recent Labs Lab 04/04/16 1227 04/04/16 1422 04/04/16 1730 04/04/16 2212 04/05/16 0725  GLUCAP 149* 127* 168* 174* 123*   Lipid Profile: No results for input(s): CHOL, HDL, LDLCALC, TRIG, CHOLHDL, LDLDIRECT in the last 72 hours. Thyroid Function Tests: No results for input(s): TSH, T4TOTAL, FREET4, T3FREE, THYROIDAB in the last 72 hours. Anemia Panel: No results for input(s): VITAMINB12, FOLATE, FERRITIN, TIBC, IRON, RETICCTPCT in the last 72 hours. Urine analysis:    Component Value Date/Time   COLORURINE YELLOW 04/03/2016 1308   APPEARANCEUR CLEAR 04/03/2016 1308   LABSPEC 1.017 04/03/2016 1308   PHURINE 7.0 04/03/2016 1308   GLUCOSEU >=500 (A) 04/03/2016 1308   HGBUR NEGATIVE 04/03/2016 1308   BILIRUBINUR NEGATIVE 04/03/2016 1308   KETONESUR NEGATIVE 04/03/2016 1308   PROTEINUR NEGATIVE 04/03/2016 1308   UROBILINOGEN 1.0 05/24/2011 1840   NITRITE NEGATIVE 04/03/2016 1308   LEUKOCYTESUR NEGATIVE 04/03/2016 1308     )No results found for this or any previous visit (from the past 240 hour(s)).    Anti-infectives    Start     Dose/Rate Route Frequency Ordered Stop   04/04/16 1800  vancomycin (VANCOCIN) 1,500 mg in sodium chloride 0.9 % 500 mL IVPB     1,500 mg 250 mL/hr over 120 Minutes Intravenous Every 24 hours 04/04/16 1243     04/03/16 1800  piperacillin-tazobactam  (ZOSYN) IVPB 3.375 g     3.375 g 12.5 mL/hr over 240 Minutes Intravenous Every 8 hours 04/03/16 1242     04/03/16 1230  vancomycin (VANCOCIN) IVPB 1000 mg/200 mL premix  Status:  Discontinued     1,000 mg 200 mL/hr over 60 Minutes Intravenous Every 12 hours 04/03/16 1209 04/04/16 1243   04/03/16 1200  piperacillin-tazobactam (ZOSYN) IVPB 3.375 g     3.375 g 100 mL/hr over 30 Minutes Intravenous  Once 04/03/16 1154 04/03/16 1312       Radiology Studies: US Renal  Result Date: 04/03/2016 CLINICAL DATA:  Acute  renal injury EXAM: RENAL / URINARY TRACT ULTRASOUND COMPLETE COMPARISON:  None. FINDINGS: Right Kidney: Length: 8.7 cm. Echogenicity within normal limits. No mass or hydronephrosis visualized. Left Kidney: Length: 9.7 cm. Echogenicity within normal limits. No mass or hydronephrosis visualized. Bladder: Appears normal for degree of bladder distention. IMPRESSION: No acute abnormality noted. Electronically Signed   By: Alcide Clever M.D.   On: 04/03/2016 13:48        Scheduled Meds: . acidophilus  1 capsule Oral Daily  . enoxaparin (LOVENOX) injection  0.5 mg/kg Subcutaneous Q24H  . insulin aspart  0-20 Units Subcutaneous TID WC  . insulin glargine  10 Units Subcutaneous Daily  . piperacillin-tazobactam (ZOSYN)  IV  3.375 g Intravenous Q8H  . simvastatin  40 mg Oral QHS  . tamsulosin  0.4 mg Oral Daily  . vancomycin  1,500 mg Intravenous Q24H   Continuous Infusions: . sodium chloride 75 mL/hr at 04/05/16 0803     LOS: 1 day    Time spent: 25 min    Tnia Anglada U Anuhea Gassner, DO [facility] Pager [phone]  If 7PM-7AM, please contact night-coverage [email or web address] Password [email or web address] 04/05/2016, 12:04 PM

## 2016-04-06 LAB — BASIC METABOLIC PANEL WITH GFR
Anion gap: 7 (ref 5–15)
BUN: 14 mg/dL (ref 6–20)
CO2: 26 mmol/L (ref 22–32)
Calcium: 8.9 mg/dL (ref 8.9–10.3)
Chloride: 103 mmol/L (ref 101–111)
Creatinine, Ser: 1.67 mg/dL — ABNORMAL HIGH (ref 0.61–1.24)
GFR calc Af Amer: 50 mL/min — ABNORMAL LOW
GFR calc non Af Amer: 43 mL/min — ABNORMAL LOW
Glucose, Bld: 121 mg/dL — ABNORMAL HIGH (ref 65–99)
Potassium: 3.9 mmol/L (ref 3.5–5.1)
Sodium: 136 mmol/L (ref 135–145)

## 2016-04-06 LAB — GLUCOSE, CAPILLARY
GLUCOSE-CAPILLARY: 96 mg/dL (ref 65–99)
Glucose-Capillary: 184 mg/dL — ABNORMAL HIGH (ref 65–99)
Glucose-Capillary: 151 mg/dL — ABNORMAL HIGH (ref 65–99)
Glucose-Capillary: 158 mg/dL — ABNORMAL HIGH (ref 65–99)

## 2016-04-06 NOTE — Progress Notes (Addendum)
PROGRESS NOTE    Jason Benton  ZOX:[id number] DOB: Jul 20, 1955 DOA: 04/03/2016 PCP: Pearla Dubonnet, MD   Outpatient Specialists:     Brief Narrative:  Jason Benton is a 61 y.o. male  with past mental history of stroke, diabetes, hypertension, hyperlipidemia presents emergency room with chief complaint of toe pain and swelling. Patient reports that he had left great toenail pain. Went to a podiatrist for evaluation and the toenails removed. This is approximately 2 weeks ago. Patient will follow-up as toe was red and looked a little infected. Patient was prescribed an antibiotic pill and cream. Patient took the pill as directed. Patient states that when walking long distances his calves go numb. When he sits to rest the feeling comes back. ED course: Patient started on vancomycin and Zosyn for cellulitis hospitalists consulted for admission. During hospitalization, patient had ABIs with moderate reduction in blood flow, IV abx and Dr. Lajoyce Corners consult.  Assessment & Plan:   Principal Problem:   Cellulitis Active Problems:   Diabetes (HCC)   Hyperlipidemia with target low density lipoprotein (LDL) cholesterol less than 70 mg/dL   Essential hypertension, benign   Cerebrovascular disease   AKI (acute kidney injury) (HCC)   Cellulitis and abscess of toe of left foot   Cellulitis Continue vancomycin and Zosyn -outpatient treatment failure -ABIs show moderate reduction in arterial flow at rest -Dr. Lajoyce Corners consulted -redness only mildly better  AKI-- most likely CKD stage III Patient denies past history of chronic kidney disease Creatinine 1.52 on admission Follow I&Os Avoid nephro toxic drugs Based on history patient likely has CKD   Symptoms of BPH Starting patient on Flomax  Diabetes- poorly controlled Sliding scale insulin -start lantus while inpatient -HgbA1C: 8.6--- improved from 1 year ago at 10.4  HLD Continue statin  Hypertension- BP low currently When necessary  hydralazine 10 mg IV as needed for severe blood pressure Hold home antihypertensives    DVT prophylaxis:  Lovenox   Code Status: Full Code   Family Communication:   Disposition Plan:     Consultants:   Ortho- duda     Subjective: Wants to go home  Objective: Vitals:   04/05/16 1255 04/05/16 2106 04/05/16 2318 04/06/16 0451  BP: 130/71 (!) 147/89  124/78  Pulse: 79 (!) 107  87  Resp: 18 18  17   Temp: 98.9 F (37.2 C) (!) 100.6 F (38.1 C) 98.4 F (36.9 C) 99.7 F (37.6 C)  TempSrc: Oral Oral Oral Oral  SpO2: 97% 97%  99%  Weight:      Height:        Intake/Output Summary (Last 24 hours) at 04/06/16 1112 Last data filed at 04/06/16 0730  Gross per 24 hour  Intake             1630 ml  Output             2325 ml  Net             -695 ml   Filed Weights   04/03/16 0935  Weight: 99.3 kg (219 lb)    Examination:  General exam: Appears calm and comfortable  Respiratory system: Clear to auscultation. Respiratory effort normal. Cardiovascular system: S1 & S2 heard, RRR. No JVD, murmurs, rubs, gallops or clicks. No pedal edema. Gastrointestinal system: Abdomen is nondistended, soft and nontender. No organomegaly or masses felt. Normal bowel sounds heard. Skin: mild improvement in redness, hard ulcer on outside of big toe left foot-- toenail 1/3 of nail bed-- pulses  diminished   Data Reviewed: I have personally reviewed following labs and imaging studies  CBC:  Recent Labs Lab 04/03/16 1015 04/04/16 0402 04/05/16 0730  WBC 7.5 7.5 7.4  NEUTROABS 5.4  --   --   HGB 14.0 12.4* 12.6*  HCT 41.0 37.8* 38.0*  MCV 83.8 84.8 84.6  PLT 161 154 159   Basic Metabolic Panel:  Recent Labs Lab 04/03/16 1015 04/03/16 1230 04/04/16 0402 04/05/16 0730 04/06/16 0311  NA 136  --  136 137 136  K 4.3  --  4.0 4.1 3.9  CL 104  --  103 103 103  CO2 25  --  27 26 26   GLUCOSE 331*  --  187* 110* 121*  BUN 23*  --  20 14 14   CREATININE 1.52*  --  1.70*  1.63* 1.67*  CALCIUM 9.2  --  8.7* 8.8* 8.9  MG  --  2.2  --   --   --   PHOS  --  2.4*  --   --   --    GFR: Estimated Creatinine Clearance: 56.5 mL/min (by C-G formula based on SCr of 1.67 mg/dL (H)). Liver Function Tests: No results for input(s): AST, ALT, ALKPHOS, BILITOT, PROT, ALBUMIN in the last 168 hours. No results for input(s): LIPASE, AMYLASE in the last 168 hours. No results for input(s): AMMONIA in the last 168 hours. Coagulation Profile: No results for input(s): INR, PROTIME in the last 168 hours. Cardiac Enzymes: No results for input(s): CKTOTAL, CKMB, CKMBINDEX, TROPONINI in the last 168 hours. BNP (last 3 results) No results for input(s): PROBNP in the last 8760 hours. HbA1C:  Recent Labs  04/04/16 0402  HGBA1C 8.6*   CBG:  Recent Labs Lab 04/05/16 0725 04/05/16 1253 04/05/16 1740 04/05/16 2110 04/06/16 0751  GLUCAP 123* 128* 171* 123* 96   Lipid Profile: No results for input(s): CHOL, HDL, LDLCALC, TRIG, CHOLHDL, LDLDIRECT in the last 72 hours. Thyroid Function Tests: No results for input(s): TSH, T4TOTAL, FREET4, T3FREE, THYROIDAB in the last 72 hours. Anemia Panel: No results for input(s): VITAMINB12, FOLATE, FERRITIN, TIBC, IRON, RETICCTPCT in the last 72 hours. Urine analysis:    Component Value Date/Time   COLORURINE YELLOW 04/03/2016 1308   APPEARANCEUR CLEAR 04/03/2016 1308   LABSPEC 1.017 04/03/2016 1308   PHURINE 7.0 04/03/2016 1308   GLUCOSEU >=500 (A) 04/03/2016 1308   HGBUR NEGATIVE 04/03/2016 1308   BILIRUBINUR NEGATIVE 04/03/2016 1308   KETONESUR NEGATIVE 04/03/2016 1308   PROTEINUR NEGATIVE 04/03/2016 1308   UROBILINOGEN 1.0 05/24/2011 1840   NITRITE NEGATIVE 04/03/2016 1308   LEUKOCYTESUR NEGATIVE 04/03/2016 1308     )No results found for this or any previous visit (from the past 240 hour(s)).    Anti-infectives    Start     Dose/Rate Route Frequency Ordered Stop   04/04/16 1800  vancomycin (VANCOCIN) 1,500 mg in sodium  chloride 0.9 % 500 mL IVPB     1,500 mg 250 mL/hr over 120 Minutes Intravenous Every 24 hours 04/04/16 1243     04/03/16 1800  piperacillin-tazobactam (ZOSYN) IVPB 3.375 g     3.375 g 12.5 mL/hr over 240 Minutes Intravenous Every 8 hours 04/03/16 1242     04/03/16 1230  vancomycin (VANCOCIN) IVPB 1000 mg/200 mL premix  Status:  Discontinued     1,000 mg 200 mL/hr over 60 Minutes Intravenous Every 12 hours 04/03/16 1209 04/04/16 1243   04/03/16 1200  piperacillin-tazobactam (ZOSYN) IVPB 3.375 g     3.375  g 100 mL/hr over 30 Minutes Intravenous  Once 04/03/16 1154 04/03/16 1312       Radiology Studies: No results found.      Scheduled Meds: . acidophilus  1 capsule Oral Daily  . enoxaparin (LOVENOX) injection  0.5 mg/kg Subcutaneous Q24H  . insulin aspart  0-20 Units Subcutaneous TID WC  . insulin glargine  10 Units Subcutaneous Daily  . piperacillin-tazobactam (ZOSYN)  IV  3.375 g Intravenous Q8H  . simvastatin  40 mg Oral QHS  . tamsulosin  0.4 mg Oral Daily  . vancomycin  1,500 mg Intravenous Q24H   Continuous Infusions:    LOS: 2 days    Time spent: 25 min    Pahola Dimmitt U Avelardo Reesman, DO [facility] Pager [phone]  If 7PM-7AM, please contact night-coverage [email or web address] Password [email or web address] 04/06/2016, 11:12 AM

## 2016-04-07 ENCOUNTER — Other Ambulatory Visit (INDEPENDENT_AMBULATORY_CARE_PROVIDER_SITE_OTHER): Payer: Self-pay | Admitting: Family

## 2016-04-07 DIAGNOSIS — N179 Acute kidney failure, unspecified: Secondary | ICD-10-CM

## 2016-04-07 LAB — GLUCOSE, CAPILLARY
Glucose-Capillary: 127 mg/dL — ABNORMAL HIGH (ref 65–99)
Glucose-Capillary: 180 mg/dL — ABNORMAL HIGH (ref 65–99)
Glucose-Capillary: 183 mg/dL — ABNORMAL HIGH (ref 65–99)
Glucose-Capillary: 152 mg/dL — ABNORMAL HIGH (ref 65–99)

## 2016-04-07 MED ORDER — POLYETHYLENE GLYCOL 3350 17 G PO PACK
17.0000 g | PACK | Freq: Every day | ORAL | Status: DC
  Administered 2016-04-07 – 2016-04-09 (×2): 17 g via ORAL
  Filled 2016-04-07 (×2): qty 1

## 2016-04-07 NOTE — Progress Notes (Signed)
Patient ID: Jason Benton, male   DOB: 12/22/1955, 60 y.o.   MRN: [id number] Despite IV antibiotics patient still has cellulitis of the left foot. There is pain and fluctuance around the left great toe ulcer. Radiographs shows a lytic bony changes consistent with chronic osteomyelitis. Ankle-brachial indices in the left 0.72 with biphasic flow. Due to persistent pain cellulitis ulceration osteomyelitis without resolution of IV antibiotics we'll plan for left great toe amputation tomorrow. Patient may discharge to home after surgery once he is safe with therapy with minimal weightbearing left foot. 

## 2016-04-07 NOTE — Progress Notes (Signed)
PROGRESS NOTE    Jason Benton  ZOX:[id number] DOB: [birth year] DOA: 04/03/2016 PCP: Pearla Dubonnet, MD   Outpatient Specialists:     Brief Narrative:  Jason Benton is a 61 y.o. male  with past mental history of stroke, diabetes, hypertension, hyperlipidemia presents emergency room with chief complaint of toe pain and swelling. Patient reports that he had left great toenail pain. Went to a podiatrist for evaluation and the toenails removed. This is approximately 2 weeks ago. Patient will follow-up as toe was red and looked a little infected. Patient was prescribed an antibiotic pill and cream. Patient took the pill as directed. Patient states that when walking long distances his calves go numb. When he sits to rest the feeling comes back. ED course: Patient started on vancomycin and Zosyn for cellulitis hospitalists consulted for admission. During hospitalization, patient had ABIs with moderate reduction in blood flow, IV abx and Dr. Lajoyce Corners consult.  Assessment & Plan:   Diabetic foot ulcer with Cellulitis -cellulitis improving, has an ulcer on R toe -Continue vancomycin and Zosyn -outpatient treatment failure -ABIs show moderate reduction in arterial flow at rest -Dr. Lajoyce Corners planning toe amputation tomorrow  AKI  Vs CKD 2 -baseline unknown -creatinine 1.6 and stable, might be his baseline  Symptoms of BPH -continue Flomax/new med  DM uncontrolled -started lantus -HgbA1C: 8.6  Hypertension -continue hydralazine PRN  DVT prophylaxis: Lovenox  Code Status:Full Code Family Communication:wife at bedside Disposition Plan: home after surgery    Consultants:   Ortho- duda     Subjective: Just heard that he needs surgery  Objective: Vitals:   04/06/16 0451 04/06/16 1432 04/06/16 2131 04/07/16 0526  BP: 124/78 135/89 138/74 112/71  Pulse: 87 (!) 101 96 88  Resp: 17   18  Temp: 99.7 F (37.6 C) 99.6 F (37.6 C) 99.6 F (37.6 C) 98.7 F (37.1 C)  TempSrc: Oral  Oral Oral Oral  SpO2: 99% 97% 96% 96%  Weight:      Height:        Intake/Output Summary (Last 24 hours) at 04/07/16 1341 Last data filed at 04/07/16 0525  Gross per 24 hour  Intake              490 ml  Output             1400 ml  Net             -910 ml   Filed Weights   04/03/16 0935  Weight: 99.3 kg (219 lb)    Examination:  General exam: AAOx3, no distress Respiratory system: Clear to auscultation. Respiratory effort normal. Cardiovascular system: S1 & S2 heard, RRR. No JVD, murmurs, rubs, gallops or clicks. No pedal edema. Gastrointestinal system: Abdomen is nondistended, soft and nontender. No organomegaly or masses felt. Normal bowel sounds heard. Skin: redness and swelling,  hard ulcer on outside of big toe left foot-- toenail 1/3 of nail bed-- pulses diminished   Data Reviewed: I have personally reviewed following labs and imaging studies  CBC:  Recent Labs Lab 04/03/16 1015 04/04/16 0402 04/05/16 0730  WBC 7.5 7.5 7.4  NEUTROABS 5.4  --   --   HGB 14.0 12.4* 12.6*  HCT 41.0 37.8* 38.0*  MCV 83.8 84.8 84.6  PLT 161 154 159   Basic Metabolic Panel:  Recent Labs Lab 04/03/16 1015 04/03/16 1230 04/04/16 0402 04/05/16 0730 04/06/16 0311  NA 136  --  136 137 136  K 4.3  --  4.0 4.1 3.9  CL 104  --  103 103 103  CO2 25  --  27 26 26   GLUCOSE 331*  --  187* 110* 121*  BUN 23*  --  20 14 14   CREATININE 1.52*  --  1.70* 1.63* 1.67*  CALCIUM 9.2  --  8.7* 8.8* 8.9  MG  --  2.2  --   --   --   PHOS  --  2.4*  --   --   --    GFR: Estimated Creatinine Clearance: 56.5 mL/min (by C-G formula based on SCr of 1.67 mg/dL (H)). Liver Function Tests: No results for input(s): AST, ALT, ALKPHOS, BILITOT, PROT, ALBUMIN in the last 168 hours. No results for input(s): LIPASE, AMYLASE in the last 168 hours. No results for input(s): AMMONIA in the last 168 hours. Coagulation Profile: No results for input(s): INR, PROTIME in the last 168 hours. Cardiac  Enzymes: No results for input(s): CKTOTAL, CKMB, CKMBINDEX, TROPONINI in the last 168 hours. BNP (last 3 results) No results for input(s): PROBNP in the last 8760 hours. HbA1C: No results for input(s): HGBA1C in the last 72 hours. CBG:  Recent Labs Lab 04/06/16 1133 04/06/16 1653 04/06/16 2206 04/07/16 0745 04/07/16 1200  GLUCAP 184* 151* 158* 127* 180*   Lipid Profile: No results for input(s): CHOL, HDL, LDLCALC, TRIG, CHOLHDL, LDLDIRECT in the last 72 hours. Thyroid Function Tests: No results for input(s): TSH, T4TOTAL, FREET4, T3FREE, THYROIDAB in the last 72 hours. Anemia Panel: No results for input(s): VITAMINB12, FOLATE, FERRITIN, TIBC, IRON, RETICCTPCT in the last 72 hours. Urine analysis:    Component Value Date/Time   COLORURINE YELLOW 04/03/2016 1308   APPEARANCEUR CLEAR 04/03/2016 1308   LABSPEC 1.017 04/03/2016 1308   PHURINE 7.0 04/03/2016 1308   GLUCOSEU >=500 (A) 04/03/2016 1308   HGBUR NEGATIVE 04/03/2016 1308   BILIRUBINUR NEGATIVE 04/03/2016 1308   KETONESUR NEGATIVE 04/03/2016 1308   PROTEINUR NEGATIVE 04/03/2016 1308   UROBILINOGEN 1.0 05/24/2011 1840   NITRITE NEGATIVE 04/03/2016 1308   LEUKOCYTESUR NEGATIVE 04/03/2016 1308     )No results found for this or any previous visit (from the past 240 hour(s)).    Anti-infectives    Start     Dose/Rate Route Frequency Ordered Stop   04/04/16 1800  vancomycin (VANCOCIN) 1,500 mg in sodium chloride 0.9 % 500 mL IVPB     1,500 mg 250 mL/hr over 120 Minutes Intravenous Every 24 hours 04/04/16 1243     04/03/16 1800  piperacillin-tazobactam (ZOSYN) IVPB 3.375 g     3.375 g 12.5 mL/hr over 240 Minutes Intravenous Every 8 hours 04/03/16 1242     04/03/16 1230  vancomycin (VANCOCIN) IVPB 1000 mg/200 mL premix  Status:  Discontinued     1,000 mg 200 mL/hr over 60 Minutes Intravenous Every 12 hours 04/03/16 1209 04/04/16 1243   04/03/16 1200  piperacillin-tazobactam (ZOSYN) IVPB 3.375 g     3.375 g 100  mL/hr over 30 Minutes Intravenous  Once 04/03/16 1154 04/03/16 1312       Radiology Studies: No results found.      Scheduled Meds: . acidophilus  1 capsule Oral Daily  . enoxaparin (LOVENOX) injection  0.5 mg/kg Subcutaneous Q24H  . insulin aspart  0-20 Units Subcutaneous TID WC  . insulin glargine  10 Units Subcutaneous Daily  . piperacillin-tazobactam (ZOSYN)  IV  3.375 g Intravenous Q8H  . polyethylene glycol  17 g Oral Daily  . simvastatin  40 mg Oral QHS  . tamsulosin  0.4 mg Oral Daily  . vancomycin  1,500 mg Intravenous Q24H   Continuous Infusions:    LOS: 3 days    Time spent: 25 min    Zannie CoveJOSEPH,Landyn Buckalew, MD [facility] Pager [phone]  If 7PM-7AM, please contact night-coverage [email or web address] Password [email or web address] 04/07/2016, 1:41 PM

## 2016-04-07 NOTE — Progress Notes (Signed)
RN paged MD due to patient having questions and requesting to see him. Awaiting a return call.

## 2016-04-07 NOTE — Progress Notes (Signed)
Pharmacy Antibiotic Note  Pablo LedgerJerry Oliff is a 61 y.o. male admitted on 04/03/2016 with toe pain and swelling.  Planning toe amputation Friday WBC WNL, AF.   Plan: Continue Vancomycin 1500 mg iv Q 24 Continue Zosyn 3.375 grams iv Q 8 hours  Follow up for antibiotic discontinuation after surgery Friday  Height: 5\' 11"  (180.3 cm) Weight: 219 lb (99.3 kg) IBW/kg (Calculated) : 75.3  Temp (24hrs), Avg:99.3 F (37.4 C), Min:98.7 F (37.1 C), Max:99.6 F (37.6 C)   Recent Labs Lab 04/03/16 1015 04/04/16 0402 04/05/16 0730 04/06/16 0311  WBC 7.5 7.5 7.4  --   CREATININE 1.52* 1.70* 1.63* 1.67*    Estimated Creatinine Clearance: 56.5 mL/min (by C-G formula based on SCr of 1.67 mg/dL (H)).    Allergies  Allergen Reactions  . Shellfish Allergy     Antimicrobials this admission: Vancomycin 1/7 >>  Zosyn 1/7 >>   Dose adjustments this admission: vanc dose adjusted 1/8  Microbiology results: none  Thank you for allowing pharmacy to be a part of this patient's care. Okey RegalLisa Rosamond Andress, PharmD [phone]  04/07/2016 11:57 AM

## 2016-04-08 ENCOUNTER — Inpatient Hospital Stay (HOSPITAL_COMMUNITY): Payer: BLUE CROSS/BLUE SHIELD | Admitting: Certified Registered"

## 2016-04-08 ENCOUNTER — Encounter (HOSPITAL_COMMUNITY)
Admission: EM | Disposition: A | Discharge status: Home Health | Payer: Self-pay | Source: Home / Self Care | Attending: Internal Medicine

## 2016-04-08 ENCOUNTER — Encounter (HOSPITAL_COMMUNITY): Payer: Self-pay | Admitting: *Deleted

## 2016-04-08 HISTORY — PX: AMPUTATION: SHX166

## 2016-04-08 LAB — SURGICAL PCR SCREEN
MRSA, PCR: NEGATIVE
STAPHYLOCOCCUS AUREUS: NEGATIVE

## 2016-04-08 LAB — GLUCOSE, CAPILLARY
GLUCOSE-CAPILLARY: 129 mg/dL — AB (ref 65–99)
GLUCOSE-CAPILLARY: 126 mg/dL — AB (ref 65–99)
GLUCOSE-CAPILLARY: 114 mg/dL — AB (ref 65–99)
GLUCOSE-CAPILLARY: 129 mg/dL — AB (ref 65–99)
GLUCOSE-CAPILLARY: 217 mg/dL — AB (ref 65–99)
Glucose-Capillary: 106 mg/dL — ABNORMAL HIGH (ref 65–99)

## 2016-04-08 SURGERY — AMPUTATION DIGIT
Anesthesia: General | Laterality: Left

## 2016-04-08 MED ORDER — FENTANYL CITRATE (PF) 100 MCG/2ML IJ SOLN
100.0000 ug | Freq: Once | INTRAMUSCULAR | Status: AC
  Administered 2016-04-08: 100 ug via INTRAVENOUS

## 2016-04-08 MED ORDER — METHOCARBAMOL 500 MG PO TABS
500.0000 mg | ORAL_TABLET | Freq: Four times a day (QID) | ORAL | Status: DC | PRN
  Administered 2016-04-09 (×2): 500 mg via ORAL
  Filled 2016-04-08 (×2): qty 1

## 2016-04-08 MED ORDER — BUPIVACAINE HCL (PF) 0.5 % IJ SOLN
INTRAMUSCULAR | Status: DC | PRN
  Administered 2016-04-08: 30 mL

## 2016-04-08 MED ORDER — METOCLOPRAMIDE HCL 5 MG PO TABS: 5.0000 mg | ORAL_TABLET | Freq: Three times a day (TID) | ORAL | Status: DC | PRN

## 2016-04-08 MED ORDER — FENTANYL CITRATE (PF) 100 MCG/2ML IJ SOLN
INTRAMUSCULAR | Status: AC
  Filled 2016-04-08: qty 2

## 2016-04-08 MED ORDER — SODIUM CHLORIDE 0.9 % IV SOLN: INTRAVENOUS | Status: DC

## 2016-04-08 MED ORDER — FENTANYL CITRATE (PF) 100 MCG/2ML IJ SOLN
INTRAMUSCULAR | Status: AC
  Administered 2016-04-08: 100 ug via INTRAVENOUS
  Filled 2016-04-08: qty 2

## 2016-04-08 MED ORDER — CEFAZOLIN SODIUM-DEXTROSE 2-4 GM/100ML-% IV SOLN
2.0000 g | INTRAVENOUS | Status: AC
  Administered 2016-04-08: 2 g via INTRAVENOUS
  Filled 2016-04-08: qty 100

## 2016-04-08 MED ORDER — METHOCARBAMOL 1000 MG/10ML IJ SOLN
500.0000 mg | Freq: Four times a day (QID) | INTRAVENOUS | Status: DC | PRN
  Filled 2016-04-08: qty 5

## 2016-04-08 MED ORDER — HYDROMORPHONE HCL 1 MG/ML IJ SOLN: 0.2500 mg | INTRAMUSCULAR | Status: DC | PRN

## 2016-04-08 MED ORDER — ACETAMINOPHEN 325 MG PO TABS: 650.0000 mg | ORAL_TABLET | Freq: Four times a day (QID) | ORAL | Status: DC | PRN

## 2016-04-08 MED ORDER — ONDANSETRON HCL 4 MG/2ML IJ SOLN
INTRAMUSCULAR | Status: DC | PRN
  Administered 2016-04-08: 4 mg via INTRAVENOUS

## 2016-04-08 MED ORDER — FENTANYL CITRATE (PF) 100 MCG/2ML IJ SOLN
INTRAMUSCULAR | Status: DC | PRN
  Administered 2016-04-08: 100 ug via INTRAVENOUS

## 2016-04-08 MED ORDER — MIDAZOLAM HCL 2 MG/2ML IJ SOLN
2.0000 mg | Freq: Once | INTRAMUSCULAR | Status: AC
  Administered 2016-04-08: 2 mg via INTRAVENOUS

## 2016-04-08 MED ORDER — ACETAMINOPHEN 650 MG RE SUPP: 650.0000 mg | Freq: Four times a day (QID) | RECTAL | Status: DC | PRN

## 2016-04-08 MED ORDER — PROPOFOL 10 MG/ML IV BOLUS
INTRAVENOUS | Status: DC | PRN
  Administered 2016-04-08: 200 mg via INTRAVENOUS

## 2016-04-08 MED ORDER — ONDANSETRON HCL 4 MG/2ML IJ SOLN
INTRAMUSCULAR | Status: AC
  Filled 2016-04-08: qty 2

## 2016-04-08 MED ORDER — LIDOCAINE HCL (CARDIAC) 20 MG/ML IV SOLN
INTRAVENOUS | Status: DC | PRN
  Administered 2016-04-08: 100 mg via INTRAVENOUS

## 2016-04-08 MED ORDER — HYDROMORPHONE HCL 2 MG/ML IJ SOLN
1.0000 mg | INTRAMUSCULAR | Status: DC | PRN
  Administered 2016-04-08 – 2016-04-09 (×3): 1 mg via INTRAVENOUS
  Filled 2016-04-08 (×3): qty 1

## 2016-04-08 MED ORDER — ONDANSETRON HCL 4 MG PO TABS: 4.0000 mg | ORAL_TABLET | Freq: Four times a day (QID) | ORAL | Status: DC | PRN

## 2016-04-08 MED ORDER — LACTATED RINGERS IV SOLN
INTRAVENOUS | Status: DC
  Administered 2016-04-08 (×2): via INTRAVENOUS

## 2016-04-08 MED ORDER — ONDANSETRON HCL 4 MG/2ML IJ SOLN: 4.0000 mg | Freq: Four times a day (QID) | INTRAMUSCULAR | Status: DC | PRN

## 2016-04-08 MED ORDER — HYDROMORPHONE HCL 1 MG/ML IJ SOLN: 1.0000 mg | INTRAMUSCULAR | Status: DC | PRN

## 2016-04-08 MED ORDER — CHLORHEXIDINE GLUCONATE 4 % EX LIQD
60.0000 mL | Freq: Once | CUTANEOUS | Status: AC
  Administered 2016-04-08: 4 via TOPICAL
  Filled 2016-04-08: qty 15

## 2016-04-08 MED ORDER — METOCLOPRAMIDE HCL 5 MG/ML IJ SOLN: 5.0000 mg | Freq: Three times a day (TID) | INTRAMUSCULAR | Status: DC | PRN

## 2016-04-08 MED ORDER — MIDAZOLAM HCL 2 MG/2ML IJ SOLN
INTRAMUSCULAR | Status: AC
  Administered 2016-04-08: 2 mg via INTRAVENOUS
  Filled 2016-04-08: qty 2

## 2016-04-08 SURGICAL SUPPLY — 30 items
BLADE SURG 21 STRL SS (BLADE) ×2 IMPLANT
BNDG COHESIVE 4X5 TAN STRL (GAUZE/BANDAGES/DRESSINGS) ×2 IMPLANT
BNDG ESMARK 4X9 LF (GAUZE/BANDAGES/DRESSINGS) IMPLANT
BNDG GAUZE ELAST 4 BULKY (GAUZE/BANDAGES/DRESSINGS) ×2 IMPLANT
COVER SURGICAL LIGHT HANDLE (MISCELLANEOUS) ×4 IMPLANT
DRAPE U-SHAPE 47X51 STRL (DRAPES) ×2 IMPLANT
DRSG ADAPTIC 3X8 NADH LF (GAUZE/BANDAGES/DRESSINGS) IMPLANT
DRSG EMULSION OIL 3X3 NADH (GAUZE/BANDAGES/DRESSINGS) ×2 IMPLANT
DRSG PAD ABDOMINAL 8X10 ST (GAUZE/BANDAGES/DRESSINGS) ×2 IMPLANT
DURAPREP 26ML APPLICATOR (WOUND CARE) ×2 IMPLANT
ELECT REM PT RETURN 9FT ADLT (ELECTROSURGICAL) ×2
ELECTRODE REM PT RTRN 9FT ADLT (ELECTROSURGICAL) ×1 IMPLANT
GAUZE SPONGE 4X4 12PLY STRL (GAUZE/BANDAGES/DRESSINGS) IMPLANT
GLOVE BIOGEL PI IND STRL 9 (GLOVE) ×1 IMPLANT
GLOVE BIOGEL PI INDICATOR 9 (GLOVE) ×1
GLOVE SURG ORTHO 9.0 STRL STRW (GLOVE) ×2 IMPLANT
GLOVE SURG SS PI 8.0 STRL IVOR (GLOVE) ×2 IMPLANT
GOWN STRL REUS W/ TWL XL LVL3 (GOWN DISPOSABLE) ×2 IMPLANT
GOWN STRL REUS W/TWL XL LVL3 (GOWN DISPOSABLE) ×2
KIT BASIN OR (CUSTOM PROCEDURE TRAY) ×2 IMPLANT
KIT ROOM TURNOVER OR (KITS) ×2 IMPLANT
MANIFOLD NEPTUNE II (INSTRUMENTS) ×2 IMPLANT
NS IRRIG 1000ML POUR BTL (IV SOLUTION) ×2 IMPLANT
PACK ORTHO EXTREMITY (CUSTOM PROCEDURE TRAY) ×2 IMPLANT
PAD ABD 8X10 STRL (GAUZE/BANDAGES/DRESSINGS) ×2 IMPLANT
PAD ARMBOARD 7.5X6 YLW CONV (MISCELLANEOUS) ×4 IMPLANT
SPONGE GAUZE 4X4 12PLY STER LF (GAUZE/BANDAGES/DRESSINGS) ×2 IMPLANT
SUT ETHILON 2 0 PSLX (SUTURE) ×2 IMPLANT
SYR CONTROL 10ML LL (SYRINGE) IMPLANT
TOWEL OR 17X26 10 PK STRL BLUE (TOWEL DISPOSABLE) ×2 IMPLANT

## 2016-04-08 NOTE — Interval H&P Note (Signed)
History and Physical Interval Note:  04/08/2016 7:05 AM  Jason Benton  has presented today for surgery, with the diagnosis of Osteomyelitis Left Great Toe  The various methods of treatment have been discussed with the patient and family. After consideration of risks, benefits and other options for treatment, the patient has consented to  Procedure(s): Left Great Toe Amputation at Metatarsophalangeal Joint (Left) as a surgical intervention .  The patient's history has been reviewed, patient examined, no change in status, stable for surgery.  I have reviewed the patient's chart and labs.  Questions were answered to the patient's satisfaction.     Nadara MustardMarcus V Duda

## 2016-04-08 NOTE — Anesthesia Procedure Notes (Signed)
Anesthesia Regional Block:  Ankle block  Pre-Anesthetic Checklist: ,, timeout performed, Correct Patient, Correct Site, Correct Laterality, Correct Procedure, Correct Position, site marked, Risks and benefits discussed, pre-op evaluation,  At surgeon's request and post-op pain management  Laterality: Left  Prep: Maximum Sterile Barrier Precautions used, chloraprep       Needles:  Injection technique: Single-shot  Needle Type: Other     Needle Length: 3cm  Needle Gauge: 25 and 25 G    Additional Needles: Ankle block Narrative:  Start time: 04/08/2016 3:45 PM End time: 04/08/2016 3:50 PM Injection made incrementally with aspirations every 5 mL. Anesthesiologist: Gaynelle AduFITZGERALD, Jasaiah Karwowski  Additional Notes: Superficial and deep Peroneal, Posterior Tibial, and Saphenous injected. Negative aspiration of blood prior to all injections.

## 2016-04-08 NOTE — Transfer of Care (Signed)
Immediate Anesthesia Transfer of Care Note  Patient: Jason Benton  Procedure(s) Performed: Procedure(s): Left Great Toe Amputation at Metatarsophalangeal Joint (Left)  Patient Location: PACU  Anesthesia Type:General  Level of Consciousness: awake and patient cooperative  Airway & Oxygen Therapy: Patient Spontanous Breathing  Post-op Assessment: Report given to RN and Post -op Vital signs reviewed and stable  Post vital signs: Reviewed and stable  Last Vitals:  Vitals:   04/08/16 1610 04/08/16 1711  BP:  (!) (P) 100/52  Pulse: 99 (P) 89  Resp: 18 (P) 16  Temp:  (P) 36.8 C    Last Pain:  Vitals:   04/08/16 1333  TempSrc: Oral  PainSc:       Patients Stated Pain Goal: 1 (04/08/16 0205)  Complications: No apparent anesthesia complications

## 2016-04-08 NOTE — Op Note (Addendum)
04/03/2016 - 04/08/2016  5:09 PM  PATIENT:  Jason LedgerJerry Benton    PRE-OPERATIVE DIAGNOSIS:  Osteomyelitis Left Great Toe  POST-OPERATIVE DIAGNOSIS:  Infection cellulitis and abscess that extended into the MTP joint and proximal  PROCEDURE:  Left first ray amputation Local tissue rearrangement for wound closure 3 x 7 cm.  SURGEON:  Nadara MustardMarcus V Duda, MD  PHYSICIAN ASSISTANT:None ANESTHESIA:   General  PREOPERATIVE INDICATIONS:  Jason Benton is a  61 y.o. male with a diagnosis of Osteomyelitis Left Great Toe who failed conservative measures and elected for surgical management.    The risks benefits and alternatives were discussed with the patient preoperatively including but not limited to the risks of infection, bleeding, nerve injury, cardiopulmonary complications, the need for revision surgery, among others, and the patient was willing to proceed.  OPERATIVE IMPLANTS: None  OPERATIVE FINDINGS: Ischemic tissue and abscess extended up to the MTP joint which required a first ray amputation.  OPERATIVE PROCEDURE: Patient brought the operating room and underwent a general anesthetic. After adequate levels anesthesia obtained patient's left lower extremity was prepped using DuraPrep draped into a sterile field a timeout was called. A fishmouth incision was made just distal to the MTP joint of the great toe. Examination showed there to be necrotic infected tissue at the level the MTP joint that extended proximal to the first metatarsal head this required a first ray amputation. The first ray exam table without complications the necrotic tissue was excised back to healthy bleeding viable tissue. Local tissue rearrangement was used for wound closure 3 x 7 cm. A sterile compressive dressing was applied patient was taken to the PACU in stable condition.

## 2016-04-08 NOTE — Progress Notes (Signed)
PROGRESS NOTE    Jason LedgerJerry Benton  ZOX:[id number] DOB: 04/06/1955 DOA: 04/03/2016 PCP: Pearla DubonnetGATES,ROBERT NEVILL, MD   Outpatient Specialists:     Brief Narrative:  Jason LedgerJerry Benton is a 61 y.o. male  with past mental history of stroke, diabetes, hypertension, hyperlipidemia presents emergency room with chief complaint of toe pain and swelling. Patient reports that he had left great toenail pain. Went to a podiatrist for evaluation and the toenails removed. This is approximately 2 weeks ago. Patient will follow-up as toe was red and looked a little infected. Patient was prescribed an antibiotic pill and cream. Patient took the pill as directed. Patient states that when walking long distances his calves go numb. When he sits to rest the feeling comes back. ED course: Patient started on vancomycin and Zosyn for cellulitis hospitalists consulted for admission. During hospitalization, patient had ABIs with moderate reduction in blood flow, IV abx and Dr. Lajoyce Cornersuda consult.  Assessment & Plan:   Diabetic foot ulcer with Cellulitis -cellulitis improving, has an ulcer on R toe  -outpatient treatment failure -remains on IV Vanc/Zosyn, change to PO Doxy tomorrow if stable -ABIs show moderate reduction in arterial flow at rest -Dr. Lajoyce Cornersuda following, plan for toe amputation today  AKI  Vs CKD 2 -baseline unknown -creatinine 1.6 and stable, might be his baseline  Symptoms of BPH -continue Flomax/new med  DM uncontrolled -started lantus -HgbA1C: 8.6, CBGs better  Hypertension -continue hydralazine PRN  DVT prophylaxis: Lovenox  Code Status:Full Code Family Communication:wife at bedside Disposition Plan: home tomorrow    Consultants:   Ortho- duda     Subjective: Just heard that he needs surgery  Objective: Vitals:   04/07/16 0526 04/07/16 1506 04/07/16 2112 04/08/16 0441  BP: 112/71 116/78 117/71 (!) 104/59  Pulse: 88 93 93 87  Resp: 18 16 16 16   Temp: 98.7 F (37.1 C) 98.6 F (37 C)  100 F (37.8 C) 98.1 F (36.7 C)  TempSrc: Oral Oral Oral Oral  SpO2: 96% 100% 94% 94%  Weight:      Height:        Intake/Output Summary (Last 24 hours) at 04/08/16 1154 Last data filed at 04/08/16 0900  Gross per 24 hour  Intake              240 ml  Output             1300 ml  Net            -1060 ml   Filed Weights   04/03/16 0935  Weight: 99.3 kg (219 lb)    Examination:  General exam: AAOx3, no distress Respiratory system: Clear to auscultation. Respiratory effort normal. Cardiovascular system: S1 & S2 heard, RRR. No JVD, murmurs, rubs, gallops or clicks. No pedal edema. Gastrointestinal system: Abdomen is nondistended, soft and nontender. No organomegaly or masses felt. Normal bowel sounds heard. Skin: redness and swelling,  hard ulcer on outside of big toe left foot-- toenail 1/3 of nail bed-- pulses diminished   Data Reviewed: I have personally reviewed following labs and imaging studies  CBC:  Recent Labs Lab 04/03/16 1015 04/04/16 0402 04/05/16 0730  WBC 7.5 7.5 7.4  NEUTROABS 5.4  --   --   HGB 14.0 12.4* 12.6*  HCT 41.0 37.8* 38.0*  MCV 83.8 84.8 84.6  PLT 161 154 159   Basic Metabolic Panel:  Recent Labs Lab 04/03/16 1015 04/03/16 1230 04/04/16 0402 04/05/16 0730 04/06/16 0311  NA 136  --  136 137 136  K 4.3  --  4.0 4.1 3.9  CL 104  --  103 103 103  CO2 25  --  27 26 26   GLUCOSE 331*  --  187* 110* 121*  BUN 23*  --  20 14 14   CREATININE 1.52*  --  1.70* 1.63* 1.67*  CALCIUM 9.2  --  8.7* 8.8* 8.9  MG  --  2.2  --   --   --   PHOS  --  2.4*  --   --   --    GFR: Estimated Creatinine Clearance: 56.5 mL/min (by C-G formula based on SCr of 1.67 mg/dL (H)). Liver Function Tests: No results for input(s): AST, ALT, ALKPHOS, BILITOT, PROT, ALBUMIN in the last 168 hours. No results for input(s): LIPASE, AMYLASE in the last 168 hours. No results for input(s): AMMONIA in the last 168 hours. Coagulation Profile: No results for input(s): INR,  PROTIME in the last 168 hours. Cardiac Enzymes: No results for input(s): CKTOTAL, CKMB, CKMBINDEX, TROPONINI in the last 168 hours. BNP (last 3 results) No results for input(s): PROBNP in the last 8760 hours. HbA1C: No results for input(s): HGBA1C in the last 72 hours. CBG:  Recent Labs Lab 04/07/16 1200 04/07/16 1558 04/07/16 2114 04/08/16 0801 04/08/16 1147  GLUCAP 180* 183* 152* 129* 126*   Lipid Profile: No results for input(s): CHOL, HDL, LDLCALC, TRIG, CHOLHDL, LDLDIRECT in the last 72 hours. Thyroid Function Tests: No results for input(s): TSH, T4TOTAL, FREET4, T3FREE, THYROIDAB in the last 72 hours. Anemia Panel: No results for input(s): VITAMINB12, FOLATE, FERRITIN, TIBC, IRON, RETICCTPCT in the last 72 hours. Urine analysis:    Component Value Date/Time   COLORURINE YELLOW 04/03/2016 1308   APPEARANCEUR CLEAR 04/03/2016 1308   LABSPEC 1.017 04/03/2016 1308   PHURINE 7.0 04/03/2016 1308   GLUCOSEU >=500 (A) 04/03/2016 1308   HGBUR NEGATIVE 04/03/2016 1308   BILIRUBINUR NEGATIVE 04/03/2016 1308   KETONESUR NEGATIVE 04/03/2016 1308   PROTEINUR NEGATIVE 04/03/2016 1308   UROBILINOGEN 1.0 05/24/2011 1840   NITRITE NEGATIVE 04/03/2016 1308   LEUKOCYTESUR NEGATIVE 04/03/2016 1308     ) Recent Results (from the past 240 hour(s))  Surgical pcr screen     Status: None   Collection Time: 04/08/16  2:15 AM  Result Value Ref Range Status   MRSA, PCR NEGATIVE NEGATIVE Final   Staphylococcus aureus NEGATIVE NEGATIVE Final    Comment:        The Xpert SA Assay (FDA approved for NASAL specimens in patients over 35 years of age), is one component of a comprehensive surveillance program.  Test performance has been validated by [facility] for patients greater than or equal to 24 year old. It is not intended to diagnose infection nor to guide or monitor treatment.       Anti-infectives    Start     Dose/Rate Route Frequency Ordered Stop   04/08/16 1500   ceFAZolin (ANCEF) IVPB 2g/100 mL premix     2 g 200 mL/hr over 30 Minutes Intravenous On call to O.R. 04/08/16 0350 04/09/16 0559   04/04/16 1800  vancomycin (VANCOCIN) 1,500 mg in sodium chloride 0.9 % 500 mL IVPB     1,500 mg 250 mL/hr over 120 Minutes Intravenous Every 24 hours 04/04/16 1243     04/03/16 1800  piperacillin-tazobactam (ZOSYN) IVPB 3.375 g     3.375 g 12.5 mL/hr over 240 Minutes Intravenous Every 8 hours 04/03/16 1242     04/03/16 1230  vancomycin (VANCOCIN)  IVPB 1000 mg/200 mL premix  Status:  Discontinued     1,000 mg 200 mL/hr over 60 Minutes Intravenous Every 12 hours 04/03/16 1209 04/04/16 1243   04/03/16 1200  piperacillin-tazobactam (ZOSYN) IVPB 3.375 g     3.375 g 100 mL/hr over 30 Minutes Intravenous  Once 04/03/16 1154 04/03/16 1312       Radiology Studies: No results found.      Scheduled Meds: . acidophilus  1 capsule Oral Daily  .  ceFAZolin (ANCEF) IV  2 g Intravenous On Call to OR  . enoxaparin (LOVENOX) injection  0.5 mg/kg Subcutaneous Q24H  . insulin aspart  0-20 Units Subcutaneous TID WC  . insulin glargine  10 Units Subcutaneous Daily  . piperacillin-tazobactam (ZOSYN)  IV  3.375 g Intravenous Q8H  . polyethylene glycol  17 g Oral Daily  . simvastatin  40 mg Oral QHS  . tamsulosin  0.4 mg Oral Daily  . vancomycin  1,500 mg Intravenous Q24H   Continuous Infusions:    LOS: 4 days    Time spent: 25 min    Zannie Cove, MD [facility] Pager [phone]  If 7PM-7AM, please contact night-coverage [email or web address] Password [facility] 04/08/2016, 11:54 AM

## 2016-04-08 NOTE — Anesthesia Preprocedure Evaluation (Addendum)
Anesthesia Evaluation  Patient identified by MRN, date of birth, ID band Patient awake    Reviewed: Allergy & Precautions, H&P , NPO status , Patient's Chart, lab work & pertinent test results  Airway Mallampati: I  TM Distance: >3 FB Neck ROM: Full    Dental no notable dental hx. (+) Teeth Intact, Dental Advisory Given   Pulmonary neg pulmonary ROS, former smoker,    Pulmonary exam normal breath sounds clear to auscultation       Cardiovascular hypertension, Pt. on medications  Rhythm:Regular Rate:Normal     Neuro/Psych negative neurological ROS  negative psych ROS   GI/Hepatic negative GI ROS, Neg liver ROS,   Endo/Other  diabetes, Type 2, Oral Hypoglycemic Agents  Renal/GU Renal InsufficiencyRenal disease  negative genitourinary   Musculoskeletal   Abdominal   Peds  Hematology negative hematology ROS (+)   Anesthesia Other Findings   Reproductive/Obstetrics negative OB ROS                            Anesthesia Physical Anesthesia Plan  ASA: II  Anesthesia Plan: General   Post-op Pain Management:  Regional for Post-op pain   Induction: Intravenous  Airway Management Planned: LMA  Additional Equipment:   Intra-op Plan:   Post-operative Plan: Extubation in OR  Informed Consent: I have reviewed the patients History and Physical, chart, labs and discussed the procedure including the risks, benefits and alternatives for the proposed anesthesia with the patient or authorized representative who has indicated his/her understanding and acceptance.   Dental advisory given  Plan Discussed with: CRNA and Surgeon  Anesthesia Plan Comments:        Anesthesia Quick Evaluation

## 2016-04-08 NOTE — Anesthesia Procedure Notes (Signed)
Procedure Name: LMA Insertion Date/Time: 04/08/2016 7:34 PM Performed by: Rosiland OzMEYERS, Tashe Purdon Pre-anesthesia Checklist: Patient identified, Emergency Drugs available, Suction available, Patient being monitored and Timeout performed Patient Re-evaluated:Patient Re-evaluated prior to inductionOxygen Delivery Method: Circle system utilized Preoxygenation: Pre-oxygenation with 100% oxygen Intubation Type: IV induction LMA: LMA inserted LMA Size: 5.0 Number of attempts: 1 Placement Confirmation: positive ETCO2 and breath sounds checked- equal and bilateral Tube secured with: Tape Dental Injury: Teeth and Oropharynx as per pre-operative assessment

## 2016-04-08 NOTE — H&P (View-Only) (Signed)
Patient ID: Jason Benton, male   DOB: 09/10/1955, 61 y.o.   MRN: [id number] Despite IV antibiotics patient still has cellulitis of the left foot. There is pain and fluctuance around the left great toe ulcer. Radiographs shows a lytic bony changes consistent with chronic osteomyelitis. Ankle-brachial indices in the left 0.72 with biphasic flow. Due to persistent pain cellulitis ulceration osteomyelitis without resolution of IV antibiotics we'll plan for left great toe amputation tomorrow. Patient may discharge to home after surgery once he is safe with therapy with minimal weightbearing left foot.

## 2016-04-09 LAB — BASIC METABOLIC PANEL
ANION GAP: 8 (ref 5–15)
BUN: 17 mg/dL (ref 6–20)
CALCIUM: 9.1 mg/dL (ref 8.9–10.3)
CO2: 25 mmol/L (ref 22–32)
Chloride: 102 mmol/L (ref 101–111)
Creatinine, Ser: 1.58 mg/dL — ABNORMAL HIGH (ref 0.61–1.24)
GFR, EST AFRICAN AMERICAN: 53 mL/min — AB (ref >60)
GFR, EST NON AFRICAN AMERICAN: 46 mL/min — AB (ref >60)
Glucose, Bld: 154 mg/dL — ABNORMAL HIGH (ref 65–99)
Potassium: 4.1 mmol/L (ref 3.5–5.1)
SODIUM: 135 mmol/L (ref 135–145)

## 2016-04-09 LAB — CBC
HCT: 36.7 % — ABNORMAL LOW (ref 39.0–52.0)
Hemoglobin: 12.4 g/dL — ABNORMAL LOW (ref 13.0–17.0)
MCH: 28.8 pg (ref 26.0–34.0)
MCHC: 33.8 g/dL (ref 30.0–36.0)
MCV: 85.2 fL (ref 78.0–100.0)
PLATELETS: 218 10*3/uL (ref 150–400)
RBC: 4.31 MIL/uL (ref 4.22–5.81)
RDW: 12.4 % (ref 11.5–15.5)
WBC: 8.6 10*3/uL (ref 4.0–10.5)

## 2016-04-09 LAB — GLUCOSE, CAPILLARY
GLUCOSE-CAPILLARY: 177 mg/dL — AB (ref 65–99)
GLUCOSE-CAPILLARY: 178 mg/dL — AB (ref 65–99)

## 2016-04-09 MED ORDER — OXYCODONE-ACETAMINOPHEN 5-325 MG PO TABS: 1.0000 | ORAL_TABLET | Freq: Four times a day (QID) | ORAL | 0 refills | Status: DC | PRN

## 2016-04-09 MED ORDER — METFORMIN HCL 500 MG PO TABS: 1000.0000 mg | ORAL_TABLET | Freq: Two times a day (BID) | ORAL | Status: AC

## 2016-04-09 MED ORDER — TAMSULOSIN HCL 0.4 MG PO CAPS: 0.4000 mg | ORAL_CAPSULE | Freq: Every day | ORAL | 0 refills | Status: AC

## 2016-04-09 MED ORDER — DOXYCYCLINE HYCLATE 100 MG PO CAPS: 100.0000 mg | ORAL_CAPSULE | Freq: Two times a day (BID) | ORAL | 0 refills | Status: DC

## 2016-04-09 MED ORDER — POLYETHYLENE GLYCOL 3350 17 G PO PACK: 17.0000 g | PACK | Freq: Every day | ORAL | 0 refills | Status: DC | PRN

## 2016-04-09 NOTE — Discharge Summary (Signed)
Physician Discharge Summary  Jason Benton RUE:[id number] DOB: 12-Aug-1955 DOA: 04/03/2016  PCP: Pearla Dubonnet, MD  Admit date: 04/03/2016 Discharge date: 04/09/2016  Time spent: 35 minutes  Recommendations for Outpatient Follow-up:  1. Dr.Duda in 2-3days for Post op wound check, pt advised to call office on Monday for FU 2. PCP Dr.Gates in 1 week  Discharge Diagnoses:    Diabetic foot ulcer with osteomyelitis/exposed bone   Cellulitis   Hyperlipidemia with target low density lipoprotein (LDL) cholesterol less than 70 mg/dL   Essential hypertension, benign   Cerebrovascular disease   AKI (acute kidney injury) (HCC)   Cellulitis and abscess of toe of left foot   Discharge Condition: improved  Diet recommendation: diabetic  Filed Weights   04/03/16 0935  Weight: 99.3 kg (219 lb)    History of present illness:  Jason Benton a 61 y.o.malewith past mental history of stroke, diabetes, hypertension, hyperlipidemia presents emergency room with chief complaint of toe pain and swelling. Patient reports that he had left great toenail pain. Went to a podiatrist for evaluation and the toenails removed. This is approximately 2 weeks ago. Patient will follow-up as toe was red and looked a little infected. Patient was prescribed an antibiotic pill and cream  Hospital Course:  Diabetic Toe ulcer with Cellulitis -admitted following outpatient treatment failure with Abx/wound care -started on Iv Abx, ortho dr.Duda consulted -ABIs show moderate reduction in arterial flow at rest -clinically felt to have Osteomyelitis by orthopedics Dr. Lajoyce Corners and hence recommended left first ray amputation  and this was completed on 1/12 -Stable postoperatively, transitioned to oral antibiotics using doxycycline at discharge -Cleared by Dr. August Saucer for discharge home today needs quick follow-up in Dr. Audrie Lia office in the next 2-3 days for postop/wound check  Suspected CKD 2 -baseline unknown, likely has  diabetic nephropathy -creatinine 1.6 and stable, might be his baseline -Stable, resumed Zestoretic since creatinine stable at 1.6  Symptoms of BPH -continue Flomax/new medication, recommended follow-up with urology  DM uncontrolled -HgbA1C: 8.6 -Insulin used inpatient, I resumed his metformin and invokana at discharge  Procedures: 1/12: PROCEDURE:  Left first ray amputation  Local tissue rearrangement for wound closure 3 x 7 cm.  Consultations:  Ortho Dr.Duda  Discharge Exam: Vitals:   04/09/16 0331 04/09/16 0526  BP: 128/66 106/66  Pulse: 99 (!) 104  Resp: 17 17  Temp: 99.5 F (37.5 C) 99.3 F (37.4 C)    General: AAOx3 Cardiovascular: S!S2/RRR Respiratory: CTAB  Discharge Instructions   Discharge Instructions    Diet - low sodium heart healthy    Complete by:  As directed    Diet Carb Modified    Complete by:  As directed    Increase activity slowly    Complete by:  As directed      Current Discharge Medication List    START taking these medications   Details  doxycycline (VIBRAMYCIN) 100 MG capsule Take 1 capsule (100 mg total) by mouth 2 (two) times daily. For 7days Qty: 14 capsule, Refills: 0    oxyCODONE-acetaminophen (PERCOCET/ROXICET) 5-325 MG tablet Take 1-2 tablets by mouth every 6 (six) hours as needed for moderate pain or severe pain. Qty: 30 tablet, Refills: 0    polyethylene glycol (MIRALAX / GLYCOLAX) packet Take 17 g by mouth daily as needed. Qty: 14 each, Refills: 0    tamsulosin (FLOMAX) 0.4 MG CAPS capsule Take 1 capsule (0.4 mg total) by mouth daily. Qty: 30 capsule, Refills: 0      CONTINUE these  medications which have CHANGED   Details  metFORMIN (GLUCOPHAGE) 500 MG tablet Take 2 tablets (1,000 mg total) by mouth 2 (two) times daily with a meal.      CONTINUE these medications which have NOT CHANGED   Details  aspirin EC 325 MG tablet Take 325 mg by mouth daily.    canagliflozin (INVOKANA) 300 MG TABS tablet Take 300 mg  by mouth daily before breakfast.    cholecalciferol (VITAMIN D) 1000 UNITS tablet Take 1,000 Units by mouth daily.    lisinopril-hydrochlorothiazide (PRINZIDE,ZESTORETIC) 10-12.5 MG per tablet Take 1 tablet by mouth daily.     simvastatin (ZOCOR) 40 MG tablet Take 40 mg by mouth daily.        Allergies  Allergen Reactions  . Shellfish Allergy     UNSPECIFIED REACTION    Follow-up Information    Nadara MustardMarcus V Duda, MD. Schedule an appointment as soon as possible for a visit in 3 day(s).   Specialty:  Orthopedic Surgery Why:  With Dr.Duda or Barnie DelErin Zamora NP in 2-3days, Do not change dressing untill seen in Dr.Dudas office next week Contact information: [address] [place] [address] [phone]            The results of significant diagnostics from this hospitalization (including imaging, microbiology, ancillary and laboratory) are listed below for reference.    Significant Diagnostic Studies: [place] Renal  Result Date: 04/03/2016 CLINICAL DATA:  Acute renal injury EXAM: RENAL / URINARY TRACT ULTRASOUND COMPLETE COMPARISON:  None. FINDINGS: Right Kidney: Length: 8.7 cm. Echogenicity within normal limits. No mass or hydronephrosis visualized. Left Kidney: Length: 9.7 cm. Echogenicity within normal limits. No mass or hydronephrosis visualized. Bladder: Appears normal for degree of bladder distention. IMPRESSION: No acute abnormality noted. Electronically Signed   By: Alcide CleverMark  Lukens M.D.   On: 04/03/2016 13:48   Dg Foot Complete Left  Result Date: 04/03/2016 CLINICAL DATA:  Left first toe swelling EXAM: LEFT FOOT - COMPLETE 3+ VIEW COMPARISON:  None. FINDINGS: Mild soft tissue swelling of the first toe is noted. No acute fracture or dislocation is seen. No bony erosion is noted. Mild vascular calcifications are seen. IMPRESSION: No acute bony abnormality noted. Electronically Signed   By: Alcide CleverMark  Lukens M.D.   On: 04/03/2016 10:44    Microbiology: Recent Results (from the past  240 hour(s))  Surgical pcr screen     Status: None   Collection Time: 04/08/16  2:15 AM  Result Value Ref Range Status   MRSA, PCR NEGATIVE NEGATIVE Final   Staphylococcus aureus NEGATIVE NEGATIVE Final    Comment:        The Xpert SA Assay (FDA approved for NASAL specimens in patients over 61 years of age), is one component of a comprehensive surveillance program.  Test performance has been validated by [facility] for patients greater than or equal to 61 year old. It is not intended to diagnose infection nor to guide or monitor treatment.      Labs: Basic Metabolic Panel:  Recent Labs Lab 04/03/16 1015 04/03/16 1230 04/04/16 0402 04/05/16 0730 04/06/16 0311 04/09/16 0407  NA 136  --  136 137 136 135  K 4.3  --  4.0 4.1 3.9 4.1  CL 104  --  103 103 103 102  CO2 25  --  27 26 26 25   GLUCOSE 331*  --  187* 110* 121* 154*  BUN 23*  --  20 14 14 17   CREATININE 1.52*  --  1.70* 1.63*  1.67* 1.58*  CALCIUM 9.2  --  8.7* 8.8* 8.9 9.1  MG  --  2.2  --   --   --   --   PHOS  --  2.4*  --   --   --   --    Liver Function Tests: No results for input(s): AST, ALT, ALKPHOS, BILITOT, PROT, ALBUMIN in the last 168 hours. No results for input(s): LIPASE, AMYLASE in the last 168 hours. No results for input(s): AMMONIA in the last 168 hours. CBC:  Recent Labs Lab 04/03/16 1015 04/04/16 0402 04/05/16 0730 04/09/16 0407  WBC 7.5 7.5 7.4 8.6  NEUTROABS 5.4  --   --   --   HGB 14.0 12.4* 12.6* 12.4*  HCT 41.0 37.8* 38.0* 36.7*  MCV 83.8 84.8 84.6 85.2  PLT 161 154 159 218   Cardiac Enzymes: No results for input(s): CKTOTAL, CKMB, CKMBINDEX, TROPONINI in the last 168 hours. BNP: BNP (last 3 results) No results for input(s): BNP in the last 8760 hours.  ProBNP (last 3 results) No results for input(s): PROBNP in the last 8760 hours.  CBG:  Recent Labs Lab 04/08/16 1533 04/08/16 1719 04/08/16 1835 04/08/16 2021 04/09/16 0753  GLUCAP 106* 114* 129* 217* 177*        SignedZannie Cove MD.  [facility] 04/09/2016, 11:31 AM

## 2016-04-09 NOTE — Progress Notes (Signed)
Subjective: Pt stable - pain ok   Objective: Vital signs in last 24 hours: Temp:  [98.1 F (36.7 C)-99.8 F (37.7 C)] 99.3 F (37.4 C) (01/13 0526) Pulse Rate:  [85-104] 104 (01/13 0526) Resp:  [14-18] 17 (01/13 0526) BP: (100-135)/(52-74) 106/66 (01/13 0526) SpO2:  [92 %-100 %] 94 % (01/13 0526)  Intake/Output from previous day: 01/12 0701 - 01/13 0700 In: 1000 [I.V.:1000] Out: 910 [Urine:900; Blood:10] Intake/Output this shift: No intake/output data recorded.  Exam:  Compartment soft  Labs:  Recent Labs  04/09/16 0407  HGB 12.4*    Recent Labs  04/09/16 0407  WBC 8.6  RBC 4.31  HCT 36.7*  PLT 218    Recent Labs  04/09/16 0407  NA 135  K 4.1  CL 102  CO2 25  BUN 17  CREATININE 1.58*  GLUCOSE 154*  CALCIUM 9.1   No results for input(s): LABPT, INR in the last 72 hours.  Assessment/Plan: Plan dc today - ok to go from ortho perspective   G Dorene GrebeScott Saia Derossett 04/09/2016, 9:42 AM

## 2016-04-09 NOTE — Evaluation (Signed)
Physical Therapy Evaluation & Discharge Patient Details Name: Jason Benton MRN: [id number] DOB: 1956/02/23 Today's Date: 04/09/2016   History of Present Illness  Jason Benton is a 61 y.o. male  with past mental history of stroke, diabetes, hypertension, hyperlipidemia presents emergency room with chief complaint of toe pain and swelling.  Now s/p L first ray amputation.  Clinical Impression  Patient presents with decreased independence and safety with mobility due to NWB/TWB L LE after amputation.  Will benefit from follow up HHPT at d/c.  No further acute PT as planned d/c today.     Follow Up Recommendations Home health PT    Equipment Recommendations  Rolling walker with 5" wheels    Recommendations for Other Services       Precautions / Restrictions Precautions Precautions: Fall Required Braces or Orthoses: Other Brace/Splint Other Brace/Splint: post op shoe Restrictions Weight Bearing Restrictions: Yes LLE Weight Bearing: Touchdown weight bearing      Mobility  Bed Mobility Overal bed mobility: Modified Independent                Transfers Overall transfer level: Needs assistance Equipment used: Rolling walker (2 wheeled);Crutches Transfers: Sit to/from Stand Sit to Stand: Supervision         General transfer comment: cues for technique and safety, maintains NWB,   Ambulation/Gait Ambulation/Gait assistance: Supervision;Min guard Ambulation Distance (Feet): 30 Feet (20) Assistive device: Rolling walker (2 wheeled);Crutches Gait Pattern/deviations: Step-to pattern;Shuffle     General Gait Details: initially with walker able to take longer step, then with crutches more jumping and shorter steps, walking last few feet with walker again  Stairs            Wheelchair Mobility    Modified Rankin (Stroke Patients Only)       Balance Overall balance assessment: Needs assistance   Sitting balance-Leahy Scale: Good     Standing balance support:  Bilateral upper extremity supported Standing balance-Leahy Scale: Poor Standing balance comment: UE support due to NWB L with pain                             Pertinent Vitals/Pain Pain Assessment: 0-10 Pain Score: 10-Worst pain ever Pain Location: L foot Pain Descriptors / Indicators: Throbbing Pain Intervention(s): Monitored during session;Repositioned    Home Living Family/patient expects to be discharged to:: Private residence Living Arrangements: Children Available Help at Discharge: Family;Available PRN/intermittently Type of Home: Apartment Home Access: Level entry     Home Layout: One level Home Equipment: None      Prior Function Level of Independence: Independent         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    Extremity/Trunk Assessment        Lower Extremity Assessment Lower Extremity Assessment: LLE deficits/detail LLE Deficits / Details: limited toe and ankle AROM with pain LLE: Unable to fully assess due to pain       Communication   Communication: No difficulties  Cognition Arousal/Alertness: Awake/alert Behavior During Therapy: WFL for tasks assessed/performed Overall Cognitive Status: Within Functional Limits for tasks assessed                      General Comments General comments (skin integrity, edema, etc.): educated in safety with device, fall precautions    Exercises     Assessment/Plan    PT Assessment All further PT needs  can be met in the next venue of care  PT Problem List Decreased balance;Decreased activity tolerance;Pain;Decreased knowledge of use of DME;Decreased mobility;Decreased safety awareness          PT Treatment Interventions      PT Goals (Current goals can be found in the Care Plan section)  Acute Rehab PT Goals PT Goal Formulation: All assessment and education complete, DC therapy    Frequency     Barriers to discharge         Co-evaluation               End of Session Equipment Utilized During Treatment: Gait belt Activity Tolerance: Patient limited by pain Patient left: in bed;with call bell/phone within reach           Time: 1107-1130 PT Time Calculation (min) (ACUTE ONLY): 23 min   Charges:   PT Evaluation $PT Eval Moderate Complexity: 1 Procedure PT Treatments $Gait Training: 8-22 mins   PT G Codes:        Reginia Naas 2016-05-04, 1:10 PM  Magda Kiel, [place] 2016/05/04

## 2016-04-09 NOTE — Care Management Note (Signed)
61 yo M  with past mental history of stroke, diabetes, hypertension, hyperlipidemia presents emergency room with chief complaint of toe pain and swelling.  s/p L first ray amputation.  PT is recommending HHPT and a RW.  Met with pt and brother at bedside. Pt plans to return home with the support of his son. Provided pt with a list of [place] agencies. He chose [facility]. Contacted Jamaine and Reggie for [facility] and DME referrals.

## 2016-04-09 NOTE — Progress Notes (Signed)
Orthopedic Tech Progress Note Patient Details:  Jason LedgerJerry Benton 08/17/1955 [id number]  Ortho Devices Type of Ortho Device: Postop shoe/boot Ortho Device/Splint Interventions: Application   [name] 04/09/2016, 11:30 AM

## 2016-04-09 NOTE — Progress Notes (Signed)
Discharge instructions gone over with patient. Home medications discussed. Prescriptions given. Follow up appointment to be made. Diet, incisional care, and reasons to call the doctor discussed. Signs and symptoms of infection and bowel regimen discussed. Patient verbalized understanding of instructions.

## 2016-04-11 ENCOUNTER — Encounter (HOSPITAL_COMMUNITY): Payer: Self-pay | Admitting: Orthopedic Surgery

## 2016-04-11 DIAGNOSIS — Z4781 Encounter for orthopedic aftercare following surgical amputation: Secondary | ICD-10-CM | POA: Diagnosis not present

## 2016-04-11 DIAGNOSIS — Z7982 Long term (current) use of aspirin: Secondary | ICD-10-CM | POA: Diagnosis not present

## 2016-04-11 DIAGNOSIS — Z8673 Personal history of transient ischemic attack (TIA), and cerebral infarction without residual deficits: Secondary | ICD-10-CM | POA: Diagnosis not present

## 2016-04-11 DIAGNOSIS — M86172 Other acute osteomyelitis, left ankle and foot: Secondary | ICD-10-CM | POA: Diagnosis not present

## 2016-04-11 DIAGNOSIS — E785 Hyperlipidemia, unspecified: Secondary | ICD-10-CM | POA: Diagnosis not present

## 2016-04-11 DIAGNOSIS — Z89422 Acquired absence of other left toe(s): Secondary | ICD-10-CM | POA: Diagnosis not present

## 2016-04-11 DIAGNOSIS — Z79891 Long term (current) use of opiate analgesic: Secondary | ICD-10-CM | POA: Diagnosis not present

## 2016-04-11 DIAGNOSIS — Z7984 Long term (current) use of oral hypoglycemic drugs: Secondary | ICD-10-CM | POA: Diagnosis not present

## 2016-04-11 DIAGNOSIS — L03116 Cellulitis of left lower limb: Secondary | ICD-10-CM | POA: Diagnosis not present

## 2016-04-11 DIAGNOSIS — I10 Essential (primary) hypertension: Secondary | ICD-10-CM | POA: Diagnosis not present

## 2016-04-11 NOTE — Anesthesia Postprocedure Evaluation (Signed)
Anesthesia Post Note  Patient: Pablo LedgerJerry Therrien  Procedure(s) Performed: Procedure(s) (LRB): Left Great Toe Amputation at Metatarsophalangeal Joint (Left)  Patient location during evaluation: PACU Anesthesia Type: General Level of consciousness: awake Pain management: satisfactory to patient Vital Signs Assessment: post-procedure vital signs reviewed and stable Respiratory status: spontaneous breathing Cardiovascular status: stable Anesthetic complications: no       Last Vitals:  Vitals:   04/09/16 0526 04/09/16 1324  BP: 106/66 (!) 151/83  Pulse: (!) 104 92  Resp: 17 18  Temp: 37.4 C 37.8 C    Last Pain:  Vitals:   04/09/16 1324  TempSrc: Oral  PainSc:                  Jiles GarterJACKSON,Avraham Benish EDWARD

## 2016-04-12 ENCOUNTER — Encounter (INDEPENDENT_AMBULATORY_CARE_PROVIDER_SITE_OTHER): Payer: Self-pay | Admitting: Family

## 2016-04-12 ENCOUNTER — Ambulatory Visit (INDEPENDENT_AMBULATORY_CARE_PROVIDER_SITE_OTHER): Payer: BLUE CROSS/BLUE SHIELD | Admitting: Family

## 2016-04-12 VITALS — Ht 71.0 in | Wt 219.0 lb

## 2016-04-12 DIAGNOSIS — Z89412 Acquired absence of left great toe: Secondary | ICD-10-CM

## 2016-04-12 NOTE — Progress Notes (Signed)
   Office Visit Note   Patient: Jason Benton           Date of Birth: 02/17/1956           MRN: [id number] Visit Date: 04/12/2016              Requested by: Marden Nobleobert Gates, MD [address] [place], [place] [address] PCP: Pearla DubonnetGATES,ROBERT NEVILL, MD  Chief Complaint  Patient presents with  . Left Foot - Routine Post Op    Left Great Toe Amputation at Metatarsophalangeal Joint 04/08/16    HPI: Patient is here for 4 day follow up left great toe amputation. There is redness dorsal foot. There is tightness and swelling. Patient is nonweightbearing with walker. He is elevating foot at home. He is taking oxycodone for pain.     Assessment & Plan: Visit Diagnoses:  1. Status post amputation of great toe, left (HCC)     Plan: Will begin daily dial soap cleansing. Dry dressings. Importance of elevation and compression discussed. Continue nonweight bearing with walker.   Follow-Up Instructions: Return in about 2 weeks (around 04/26/2016).   Ortho Exam Incision is well approximated with sutures. There is massive swelling. There is no gaping of the incision no drainage no cellulitis.  Imaging: No results found.  Orders:  No orders of the defined types were placed in this encounter.  No orders of the defined types were placed in this encounter.    Procedures: No procedures performed  Clinical Data: No additional findings.  Subjective: Review of Systems  Constitutional: Negative for chills and fever.    Objective: Vital Signs: Ht 5\' 11"  (1.803 m)   Wt 219 lb (99.3 kg)   BMI 30.54 kg/m   Specialty Comments:  No specialty comments available.  PMFS History: Patient Active Problem List   Diagnosis Date Noted  . Status post amputation of great toe, left (HCC) 04/12/2016  . Cellulitis and abscess of toe of left foot   . Cellulitis 04/03/2016  . AKI (acute kidney injury) (HCC) 04/03/2016  . Diabetes (HCC) 06/27/2013  . Hyperlipidemia with target low density  lipoprotein (LDL) cholesterol less than 70 mg/dL 72/53/664404/04/2013  . Essential hypertension, benign 06/27/2013  . Cerebrovascular disease 06/27/2013   Past Medical History:  Diagnosis Date  . Diabetes mellitus   . Hyperlipemia   . Hypertension   . Stroke [facility](HCC)     Family History  Problem Relation Age of Onset  . Stroke Father   . Hypertension Sister     Past Surgical History:  Procedure Laterality Date  . AMPUTATION Left 04/08/2016   Procedure: Left Great Toe Amputation at Metatarsophalangeal Joint;  Surgeon: Nadara MustardMarcus V Duda, MD;  [facility];  Service: Orthopedics;  Laterality: Left;   Social History   Occupational History  . Not on file.   Social History Main Topics  . Smoking status: Former Games developermoker  . Smokeless tobacco: Former NeurosurgeonUser  . Alcohol use Yes  . Drug use: No  . Sexual activity: Yes    Partners: Female

## 2016-04-13 ENCOUNTER — Ambulatory Visit (INDEPENDENT_AMBULATORY_CARE_PROVIDER_SITE_OTHER): Payer: BLUE CROSS/BLUE SHIELD | Admitting: Family

## 2016-04-15 ENCOUNTER — Ambulatory Visit: Payer: BLUE CROSS/BLUE SHIELD

## 2016-04-15 DIAGNOSIS — I10 Essential (primary) hypertension: Secondary | ICD-10-CM | POA: Diagnosis not present

## 2016-04-15 DIAGNOSIS — E1165 Type 2 diabetes mellitus with hyperglycemia: Secondary | ICD-10-CM | POA: Diagnosis not present

## 2016-04-15 DIAGNOSIS — G4700 Insomnia, unspecified: Secondary | ICD-10-CM | POA: Diagnosis not present

## 2016-04-15 DIAGNOSIS — Z89422 Acquired absence of other left toe(s): Secondary | ICD-10-CM | POA: Diagnosis not present

## 2016-04-16 ENCOUNTER — Ambulatory Visit (INDEPENDENT_AMBULATORY_CARE_PROVIDER_SITE_OTHER): Payer: Self-pay | Admitting: Physician Assistant

## 2016-04-16 ENCOUNTER — Telehealth: Payer: Self-pay | Admitting: *Deleted

## 2016-04-16 ENCOUNTER — Encounter: Payer: Self-pay | Admitting: Urgent Care

## 2016-04-16 VITALS — BP 110/70 | HR 96 | Temp 98.1°F | Resp 17 | Ht 71.5 in | Wt 214.0 lb

## 2016-04-16 DIAGNOSIS — N400 Enlarged prostate without lower urinary tract symptoms: Secondary | ICD-10-CM | POA: Insufficient documentation

## 2016-04-16 DIAGNOSIS — Z0289 Encounter for other administrative examinations: Secondary | ICD-10-CM

## 2016-04-16 NOTE — Progress Notes (Addendum)
This patient presents for DOT examination for fitness for duty. Stroke 2003 - he has been driving since then - he is unsure whether he got cleared - he has no deficits that he knows of L great toe amputation - 04/08/2016 - Dr Lajoyce Corners DM - 04/04/2016 - 8.6 -  HTN - on medication - does not check his BP at home  Medical History:  yes  1. Head/Brain Injuries, disorders or illnesses - h/o stroke in 2003 - no deficits no  2. Seizures, epilepsy no  3. Eye disorders or impaired vision (except corrective lenses) - wears to glasses to drive no  4. Ear disorders, loss of hearing or balance no  5. Heart disease or heart attack, other cardiovascular condition no  6. Heart surgery (valve replacement/bypass, angioplasty, pacemaker/defribrillator) yes  7. High blood pressure - on medications yes  8. High cholesterol - on medications no  9. Chronic cough, shortness of breath or other breathing problems no  10. Lung disease (emphysema, asthma or chronic bronchitis) no  11. Kidney disease, dialysis no  12. Digestive problems  yes  13. Diabetes or elevated blood sugar  no  Insulin use no  14. Nervous or psychiatric disorders, e.g., severe depression no  15. Fainting or syncope no  16. Dizziness, headaches, numbness, tingling or memory loss no  17. Unexplained weight loss yes  18. Stroke, TIA or paralysis - 2003 no deficits yes  19. Missing or impaired hand, arm, foot, leg, finger, toe -  Left great toe amputation 03/2016 no  20. Spinal injury or disease no  21. Bone, muscles or nerve problems no  22. Blood clots or bleeding bleeding disorders no  23. Cancer no  24. Chronic infection or other chronic diseases no  25. Sleep disorders, pauses in breathing while asleep, daytime sleepiness, loud snoring no  26. Have you ever had a sleep test? yes  27.  Have you ever spent a night in the hospital? With recent toe amputation no  28. Have you ever had a broken bone? no  29. Have you or or do you use  tobacco products? no  30. Regular, frequent alcohol use no  31. Illegal substance use within the past 2 years no  32.  Have you ever failed a drug test or been dependent on an illegal substance?  Current Medications: Prior to Admission medications   Medication Sig Start Date End Date Taking? Authorizing Provider  aspirin EC 325 MG tablet Take 325 mg by mouth daily.   Yes Historical Provider, MD  canagliflozin (INVOKANA) 300 MG TABS tablet Take 300 mg by mouth daily before breakfast.   Yes Historical Provider, MD  cholecalciferol (VITAMIN D) 1000 UNITS tablet Take 1,000 Units by mouth daily.   Yes Historical Provider, MD  doxycycline (VIBRAMYCIN) 100 MG capsule Take 1 capsule (100 mg total) by mouth 2 (two) times daily. For 7days 04/09/16  Yes Zannie Cove, MD  lisinopril-hydrochlorothiazide (PRINZIDE,ZESTORETIC) 10-12.5 MG per tablet Take 1 tablet by mouth daily.    Yes Historical Provider, MD  metFORMIN (GLUCOPHAGE) 500 MG tablet Take 2 tablets (1,000 mg total) by mouth 2 (two) times daily with a meal. 04/09/16  Yes Zannie Cove, MD  oxyCODONE-acetaminophen (PERCOCET/ROXICET) 5-325 MG tablet Take 1-2 tablets by mouth every 6 (six) hours as needed for moderate pain or severe pain. 04/09/16  Yes Zannie Cove, MD  polyethylene glycol (MIRALAX / GLYCOLAX) packet Take 17 g by mouth daily as needed. 04/09/16  Yes Zannie Cove,  MD  simvastatin (ZOCOR) 40 MG tablet Take 40 mg by mouth daily.    Yes Historical Provider, MD  tamsulosin (FLOMAX) 0.4 MG CAPS capsule Take 1 capsule (0.4 mg total) by mouth daily. 04/10/16  Yes Zannie CovePreetha Joseph, MD    Medical Examiner's Comments on Health History:  See above  TESTING:   Visual Acuity Screening   Right eye Left eye Both eyes  Without correction:     With correction: 20/20 20/20 20/20   Hearing Screening Comments: Peripheral Vision: Right eye 85 degrees. Left eye 85  degrees. The patient can distinguish the colors red, amber and green. The patient  was able to hear a forced whisper from L=10 R=10  feet.  Monocular Vision: No.  Hearing Aid used for test: No. Hearing Aid required to to meet standard: No.  BP (!) 144/84 (BP Location: Right Arm, Patient Position: Sitting, Cuff Size: Normal)   Pulse 96   Temp 98.1 F (36.7 C) (Oral)   Resp 17   Ht 5' 11.5" (1.816 m)   Wt 214 lb (97.1 kg)   SpO2 96%   BMI 29.43 kg/m  Pulse rate is regular  Comments: spgr=1.020 glu=neg, blood=small, pro=neg  PHYSICAL EXAMINATION:  1. normal General Appearance: Marked overweight, tremor, signs of alcoholism, problem drinking or drug abuse. 2. normal Skin Exam - tattoos, scars 3. normal Eyes: pupillary equality, reaction to light, accommodation, ocular motility, ocular muscle imbalance, extra ocular movement, nystagmus, exopthalmos. Ask about retinopathy, cataracts, aphakia, glaucoma, macular degeneration and refer to a specialist if appropriate.  4. normal Ears: Scarring of tympanic membrane, occlusion of external canal, perforated eardrums.     5. normal Mouth and Throat: Irremedial deformities likely to interfere with breathing or swallowing.    6. normal Heart: Murmurs, extra sounds, enlarged heart, pacemaker, implantable defibrillator.     7. normal Lungs and Chest, not including breast examination: Abnormal Chest wall expansion, abnormal respiratory rate, abnormal breath sounds including wheezes or alveolar rates, impaired respiratory function, cyanosis. Abnormal findings on physical exam may require further testing such as pulmonary tests and/or x ray of chest.  8. normal Abdomen and Viscera: Enlarged liver, enlarged spleen, masses, bruits, hernia, significant abdominal wall muscle weakness.  9. normal Genitourinary System: Hernia  10. normal Spine, other musculoskeletal: Previous surgery, deformities, limitation of motion, tenderness. 11. abnormal: missing left great toe from 04/08/2016 surgery Extremities-Limb impaired: Loss or impairment of  leg, foot, toe, arm, hand, finger. Perceptible limp, deformities, atrophy, weakness, paralysis, clubbing, edema, hypotonia. Insufficient grasp and prehension to maintain steering wheel grip. Insufficient mobility and strength in lower limb to operate pedals properly. 12. normal Neurological: Impaired equilibrium, coordination or speech pattern; paresthesia, asymmetric deep tendon reflexes, sensory or positional abnormalities, abnormal patellar and Babinski's reflexes 13. abnormal: walking on walker Gait - antalgic, ataxia  14. normal Vascular System: Abnormal pulse and amplitude, carotid or arterial bruits, varicose veins.   Does not meet standards. Certification Status: does not meet standards for 2 year certificate.  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

## 2016-04-16 NOTE — Patient Instructions (Signed)
     IF you received an x-ray today, you will receive an invoice from [facility]. Please contact [facility] at [phone] with questions or concerns regarding your invoice.   IF you received labwork today, you will receive an invoice from [facility]. Please contact [facility] at [phone] with questions or concerns regarding your invoice.   Our billing staff will not be able to assist you with questions regarding bills from these companies.  You will be contacted with the lab results as soon as they are available. The fastest way to get your results is to activate your My Chart account. Instructions are located on the last page of this paperwork. If you have not heard from us regarding the results in 2 weeks, please contact this office.     

## 2016-04-17 NOTE — Telephone Encounter (Signed)
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

## 2016-04-22 ENCOUNTER — Encounter (INDEPENDENT_AMBULATORY_CARE_PROVIDER_SITE_OTHER): Payer: Self-pay | Admitting: Family

## 2016-04-22 ENCOUNTER — Ambulatory Visit (INDEPENDENT_AMBULATORY_CARE_PROVIDER_SITE_OTHER): Payer: BLUE CROSS/BLUE SHIELD | Admitting: Family

## 2016-04-22 VITALS — Ht 71.5 in | Wt 214.0 lb

## 2016-04-22 DIAGNOSIS — Z89412 Acquired absence of left great toe: Secondary | ICD-10-CM

## 2016-04-22 NOTE — Progress Notes (Signed)
   Office Visit Note   Patient: Jason LedgerJerry Benton           Date of Birth: 03/06/1956           MRN: [id number] Visit Date: 04/22/2016              Requested by: Marden Nobleobert Gates, MD [address] [place], [place] [address] PCP: Pearla DubonnetGATES,ROBERT NEVILL, MD  Chief Complaint  Patient presents with  . Left Foot - Wound Check    04/08/16 Left Great Toe Amputation at Metatarsophalangeal Joint. 2 weeks post op.    HPI: Patient is here for evaluation of left foot status post 1st great toe amputation. There is swelling, tightness, and bloody drainage. There is no odor. Patient complains of constant pain dorsally. There is tenderness to palpation and warmth. He is weightbearing with post operative shoe wear.     Assessment & Plan: Visit Diagnoses: No diagnosis found.  Plan: Continue with daily wound cleansing. Dressed with a dry dressing and Ace wrap. The importance of elevation. He will follow up in 2 more weeks for suture removal.  Follow-Up Instructions: No Follow-up on file.   Ortho Exam  Left foot incision is proximate with sutures. Does have considerable swelling to the left foot. There is a little bit of eschar along the incision line. No drainage no warmth no cellulitis.  Imaging: No results found.  Orders:  No orders of the defined types were placed in this encounter.  No orders of the defined types were placed in this encounter.    Procedures: No procedures performed  Clinical Data: No additional findings.  Subjective: Review of Systems  Constitutional: Negative for chills and fever.    Objective: Vital Signs: Ht 5' 11.5" (1.816 m)   Wt 214 lb (97.1 kg)   BMI 29.43 kg/m   Specialty Comments:  No specialty comments available.  PMFS History: Patient Active Problem List   Diagnosis Date Noted  . BPH (benign prostatic hyperplasia) 04/16/2016  . Status post amputation of great toe, left (HCC) 04/12/2016  . Cellulitis and abscess of toe of left foot   .  Cellulitis 04/03/2016  . AKI (acute kidney injury) (HCC) 04/03/2016  . Diabetes (HCC) 06/27/2013  . Hyperlipidemia with target low density lipoprotein (LDL) cholesterol less than 70 mg/dL 19/14/782904/04/2013  . Essential hypertension, benign 06/27/2013  . Cerebrovascular disease 06/27/2013   Past Medical History:  Diagnosis Date  . Diabetes mellitus   . Hyperlipemia   . Hypertension   . Stroke [facility](HCC)     Family History  Problem Relation Age of Onset  . Stroke Father   . Hypertension Sister     Past Surgical History:  Procedure Laterality Date  . AMPUTATION Left 04/08/2016   Procedure: Left Great Toe Amputation at Metatarsophalangeal Joint;  Surgeon: Nadara MustardMarcus V Duda, MD;  [facility];  Service: Orthopedics;  Laterality: Left;   Social History   Occupational History  . Not on file.   Social History Main Topics  . Smoking status: Former Games developermoker  . Smokeless tobacco: Former NeurosurgeonUser  . Alcohol use Yes  . Drug use: No  . Sexual activity: Yes    Partners: Female

## 2016-04-25 ENCOUNTER — Telehealth (INDEPENDENT_AMBULATORY_CARE_PROVIDER_SITE_OTHER): Payer: Self-pay | Admitting: *Deleted

## 2016-04-25 NOTE — Telephone Encounter (Signed)
Pt is s/p a left great toe amputation 04/08/16 had refill of Percocet onthat day qty #30. Pt requesting refill please advise.

## 2016-04-25 NOTE — Telephone Encounter (Signed)
Patient called in this morning in regards to needing a refill on his Oxycodone please. His CB # [phone].

## 2016-04-26 ENCOUNTER — Ambulatory Visit (INDEPENDENT_AMBULATORY_CARE_PROVIDER_SITE_OTHER): Payer: BLUE CROSS/BLUE SHIELD | Admitting: Family

## 2016-04-26 ENCOUNTER — Other Ambulatory Visit (INDEPENDENT_AMBULATORY_CARE_PROVIDER_SITE_OTHER): Payer: Self-pay | Admitting: Orthopedic Surgery

## 2016-04-26 MED ORDER — OXYCODONE-ACETAMINOPHEN 5-325 MG PO TABS: 1.0000 | ORAL_TABLET | Freq: Four times a day (QID) | ORAL | 0 refills | Status: AC | PRN

## 2016-04-26 NOTE — Telephone Encounter (Signed)
Prescription written

## 2016-04-26 NOTE — Telephone Encounter (Signed)
Called pt and advised that rx is at the front desk for pick up.  

## 2016-05-03 ENCOUNTER — Ambulatory Visit (INDEPENDENT_AMBULATORY_CARE_PROVIDER_SITE_OTHER): Payer: BLUE CROSS/BLUE SHIELD | Admitting: Family

## 2016-05-03 ENCOUNTER — Encounter (INDEPENDENT_AMBULATORY_CARE_PROVIDER_SITE_OTHER): Payer: Self-pay | Admitting: Family

## 2016-05-03 VITALS — Ht 71.5 in | Wt 214.0 lb

## 2016-05-03 DIAGNOSIS — Z89412 Acquired absence of left great toe: Secondary | ICD-10-CM

## 2016-05-03 MED ORDER — SULFAMETHOXAZOLE-TRIMETHOPRIM 800-160 MG PO TABS: 1.0000 | ORAL_TABLET | Freq: Two times a day (BID) | ORAL | 0 refills | Status: DC

## 2016-05-03 NOTE — Progress Notes (Signed)
Office Visit Note   Patient: Jason LedgerJerry Giangregorio           Date of Birth: 08/22/1955           MRN: [id number] Visit Date: 05/03/2016              Requested by: Marden Nobleobert Gates, MD [address] [place], [place] [address] PCP: Pearla DubonnetGATES,ROBERT NEVILL, MD  Chief Complaint  Patient presents with  . Left Foot - Routine Post Op    Left Great Toe Amputation at Metatarsophalangeal Joint 04/08/16    HPI: Patient presents for follow up left foot great toe amputation. There is swelling, tightness, with bloody drainage along incision. He states he has pain with left foot all the time. He is currently taking oxycodone for his pain. Sutures are intact. Patient is weightbearing through heel with post operative shoe.    Assessment & Plan: Visit Diagnoses:  1. Status post amputation of great toe, left (HCC)     Plan: Have provided a prescription for Bactrim which he'll begin taking. He will continue weightbearing through the heel. Continue cleansing the wound daily. Apply dry dressing. Follow up in the office in one more week for suture removal.  Follow-Up Instructions: Return in about 1 week (around 05/10/2016).   Ortho Exam Left first ray amputation is approximated with sutures. It is slowly healing. There is some gaping. There is bloody drainage. This does not probe. Moderate swelling over the foot with tension on sutures. No erythema. Does have tenderness over dorsum of foot.   Imaging: No results found.  Orders:  No orders of the defined types were placed in this encounter.  Meds ordered this encounter  Medications  . DISCONTD: sulfamethoxazole-trimethoprim (BACTRIM DS,SEPTRA DS) 800-160 MG tablet    Sig: Take 1 tablet by mouth 2 (two) times daily.    Dispense:  20 tablet    Refill:  0  . sulfamethoxazole-trimethoprim (BACTRIM DS,SEPTRA DS) 800-160 MG tablet    Sig: Take 1 tablet by mouth 2 (two) times daily.    Dispense:  20 tablet    Refill:  0     Procedures: No procedures  performed  Clinical Data: No additional findings.  Subjective: Review of Systems  Constitutional: Negative for chills and fever.    Objective: Vital Signs: Ht 5' 11.5" (1.816 m)   Wt 214 lb (97.1 kg)   BMI 29.43 kg/m   Specialty Comments:  No specialty comments available.  PMFS History: Patient Active Problem List   Diagnosis Date Noted  . BPH (benign prostatic hyperplasia) 04/16/2016  . Status post amputation of great toe, left (HCC) 04/12/2016  . Cellulitis and abscess of toe of left foot   . Cellulitis 04/03/2016  . AKI (acute kidney injury) (HCC) 04/03/2016  . Diabetes (HCC) 06/27/2013  . Hyperlipidemia with target low density lipoprotein (LDL) cholesterol less than 70 mg/dL 46/96/295204/04/2013  . Essential hypertension, benign 06/27/2013  . Cerebrovascular disease 06/27/2013   Past Medical History:  Diagnosis Date  . Diabetes mellitus   . Hyperlipemia   . Hypertension   . Stroke [facility](HCC)     Family History  Problem Relation Age of Onset  . Stroke Father   . Hypertension Sister     Past Surgical History:  Procedure Laterality Date  . AMPUTATION Left 04/08/2016   Procedure: Left Great Toe Amputation at Metatarsophalangeal Joint;  Surgeon: Nadara MustardMarcus V Duda, MD;  [facility];  Service: Orthopedics;  Laterality: Left;   Social History  Occupational History  . Not on file.   Social History Main Topics  . Smoking status: Former Games developer  . Smokeless tobacco: Former Neurosurgeon  . Alcohol use Yes  . Drug use: No  . Sexual activity: Yes    Partners: Female

## 2016-05-12 ENCOUNTER — Encounter (INDEPENDENT_AMBULATORY_CARE_PROVIDER_SITE_OTHER): Payer: Self-pay | Admitting: Orthopedic Surgery

## 2016-05-12 ENCOUNTER — Ambulatory Visit (INDEPENDENT_AMBULATORY_CARE_PROVIDER_SITE_OTHER): Payer: BLUE CROSS/BLUE SHIELD | Admitting: Orthopedic Surgery

## 2016-05-12 VITALS — Ht 71.0 in | Wt 214.0 lb

## 2016-05-12 DIAGNOSIS — Z89412 Acquired absence of left great toe: Secondary | ICD-10-CM

## 2016-05-12 MED ORDER — SULFAMETHOXAZOLE-TRIMETHOPRIM 800-160 MG PO TABS: 1.0000 | ORAL_TABLET | Freq: Two times a day (BID) | ORAL | 0 refills | Status: AC

## 2016-05-12 MED ORDER — GABAPENTIN 100 MG PO CAPS: ORAL_CAPSULE | ORAL | 0 refills | Status: AC

## 2016-05-12 NOTE — Progress Notes (Signed)
Office Visit Note   Patient: Jason Benton           Date of Birth: 02-24-1956           MRN: [id number] Visit Date: 05/12/2016              Requested by: Marden Noble, MD [address] [place], [place] [address] PCP: Pearla Dubonnet, MD  Chief Complaint  Patient presents with  . Left Foot - Routine Post Op    Left Great Toe Amputation at Metatarsophalangeal Joint 04/08/16      HPI: Patient is s/p a  Left Great Toe Amputation at Metatarsophalangeal Joint 04/08/16. He has one dose left of his Bactrim and dry dressing applied daily. The pt is wering a post op shoe and is full weight bearing. The incision is slightly open and discolored. there is slight swelling and spot pink drainage. The ask is when is he allowed to return to work otherwise no other questions. Rodena Medin, RMA    Assessment & Plan: Visit Diagnoses:  1. Status post amputation of great toe, left (HCC)     Plan: Encouraged to continue out of work. Continue to minimize weightbearing. We'll cleanse the wound daily. Apply dry dressing and Ace wrap have refilled his Bactrim. Also provided a prescription for Neurontin for nerve pain.  Follow-Up Instructions: Return in about 2 weeks (around 05/26/2016).   Ortho Exam Left foot: first ray amputation is gaped a little proximally. This is 3 mm deep. Does not probe to bone. Granulation tissue in wound bed. Bloody drainage. No surrounding erythema. No odor. Minimal swelling over foot.   Imaging: No results found.  Orders:  No orders of the defined types were placed in this encounter.  Meds ordered this encounter  Medications  . sulfamethoxazole-trimethoprim (BACTRIM DS,SEPTRA DS) 800-160 MG tablet    Sig: Take 1 tablet by mouth 2 (two) times daily.    Dispense:  60 tablet    Refill:  0  . gabapentin (NEURONTIN) 100 MG capsule    Sig: Take 1 capsule in the evening for one 1 week, then bid for 1 week, then tid    Dispense:  90 capsule    Refill:  0       Procedures: No procedures performed  Clinical Data: No additional findings.  Subjective: Review of Systems  Constitutional: Negative for chills and fever.    Objective: Vital Signs: Ht 5\' 11"  (1.803 m)   Wt 214 lb (97.1 kg)   BMI 29.85 kg/m   Specialty Comments:  No specialty comments available.  PMFS History: Patient Active Problem List   Diagnosis Date Noted  . BPH (benign prostatic hyperplasia) 04/16/2016  . Status post amputation of great toe, left (HCC) 04/12/2016  . Cellulitis and abscess of toe of left foot   . Cellulitis 04/03/2016  . AKI (acute kidney injury) (HCC) 04/03/2016  . Diabetes (HCC) 06/27/2013  . Hyperlipidemia with target low density lipoprotein (LDL) cholesterol less than 70 mg/dL 56/21/3086  . Essential hypertension, benign 06/27/2013  . Cerebrovascular disease 06/27/2013   Past Medical History:  Diagnosis Date  . Diabetes mellitus   . Hyperlipemia   . Hypertension   . Stroke [facility])     Family History  Problem Relation Age of Onset  . Stroke Father   . Hypertension Sister     Past Surgical History:  Procedure Laterality Date  . AMPUTATION Left 04/08/2016   Procedure: Left Great Toe Amputation at Metatarsophalangeal Joint;  Surgeon: Nadara MustardMarcus V Duda, MD;  [facility];  Service: Orthopedics;  Laterality: Left;   Social History   Occupational History  . Not on file.   Social History Main Topics  . Smoking status: Former Games developermoker  . Smokeless tobacco: Former NeurosurgeonUser  . Alcohol use Yes  . Drug use: No  . Sexual activity: Yes    Partners: Female

## 2016-05-26 ENCOUNTER — Telehealth (INDEPENDENT_AMBULATORY_CARE_PROVIDER_SITE_OTHER): Payer: Self-pay | Admitting: Orthopedic Surgery

## 2016-05-26 ENCOUNTER — Encounter (INDEPENDENT_AMBULATORY_CARE_PROVIDER_SITE_OTHER): Payer: Self-pay | Admitting: Orthopedic Surgery

## 2016-05-26 ENCOUNTER — Ambulatory Visit (INDEPENDENT_AMBULATORY_CARE_PROVIDER_SITE_OTHER): Payer: BLUE CROSS/BLUE SHIELD | Admitting: Orthopedic Surgery

## 2016-05-26 VITALS — Ht 71.0 in | Wt 214.0 lb

## 2016-05-26 DIAGNOSIS — Z89412 Acquired absence of left great toe: Secondary | ICD-10-CM

## 2016-05-26 MED ORDER — GABAPENTIN 300 MG PO CAPS: 300.0000 mg | ORAL_CAPSULE | Freq: Three times a day (TID) | ORAL | 3 refills | Status: DC

## 2016-05-26 MED ORDER — NITROGLYCERIN 0.2 MG/HR TD PT24: 0.2000 mg | MEDICATED_PATCH | Freq: Every day | TRANSDERMAL | 12 refills | Status: AC

## 2016-05-26 NOTE — Progress Notes (Addendum)
Office Visit Note   Patient: Jason Benton           Date of Birth: 12/25/1955           MRN: [id number] Visit Date: 05/26/2016              Requested by: Marden Nobleobert Gates, MD [address] [place], [place] [address] PCP: Pearla DubonnetGATES,ROBERT NEVILL, MD  Chief Complaint  Patient presents with  . Left Foot - Routine Post Op    Patient is s/p a  Left Great Toe Amputation at Metatarsophalangeal Joint 04/08/16.     HPI: Patient is follow up for left foot great toe amputation. The pt is full weight bearing in a post op shoe covering the are awith a dry dressing. There was a small amount of yellow, clear drain but the foot is completely macerated at the incision and under the foot. It does not coincide with the frequency of dressing changes as reported by pt or the fact that they are dry dressings. The entire length of the incision is gaped open. The foot has some swelling. There is no odor and the pt is not on any ABX. Rodena MedinAutumn L Forrest, RMA    Assessment & Plan: Visit Diagnoses: No diagnosis found.  Plan: We will start in the joint was attached to improve the microcirculation. He'll complete his course of Bactrim DS. His dose of Neurontin was increased to 300 mg 3 times a day. Also has discussed the importance of nonweightbearing on the left foot. Discussed that with his continued full weightbearing he is at risk of complete wound dehiscence and with this he would require a transtibial amputation. Patient states he is anxious to go back to work but I discussed the importance of getting this to heal up prior to returning to work.  Follow-Up Instructions: Return in about 2 weeks (around 06/09/2016).   Ortho Exam Examination patient has granulation tissue at the base of the wound but there are mild ischemic changes along the wound edges. The wound is gaped open about 3 mm for the entire length of the wound. Patient is a strong dorsalis pedis and posterior tibial biphasic pulse as per Doppler,  performed today. Patient complains of increased neuropathic pain with the low-dose of Neurontin 100 mg 3 times a day.  Imaging: No results found.  Labs: Lab Results  Component Value Date   HGBA1C 8.6 (H) 04/04/2016   HGBA1C 9.7 04/24/2015   HGBA1C 10.4 04/17/2015   LABURIC 4.4 04/04/2016   REPTSTATUS 05/27/2011 FINAL 05/24/2011   CULT ESCHERICHIA COLI 05/24/2011   LABORGA ESCHERICHIA COLI 05/24/2011    Orders:  No orders of the defined types were placed in this encounter.  Meds ordered this encounter  Medications  . gabapentin (NEURONTIN) 300 MG capsule    Sig: Take 1 capsule (300 mg total) by mouth 3 (three) times daily. 3 times a day when necessary neuropathy pain    Dispense:  90 capsule    Refill:  3  . nitroGLYCERIN (NITRODUR - DOSED IN MG/24 HR) 0.2 mg/hr patch    Sig: Place 1 patch (0.2 mg total) onto the skin daily.    Dispense:  30 patch    Refill:  12     Procedures: No procedures performed  Clinical Data: No additional findings.  Subjective: Review of Systems  Objective: Vital Signs: Ht 5\' 11"  (1.803 m)   Wt 214 lb (97.1 kg)   BMI 29.85 kg/m   Specialty Comments:  No specialty comments available.  PMFS History: Patient Active Problem List   Diagnosis Date Noted  . BPH (benign prostatic hyperplasia) 04/16/2016  . Status post amputation of great toe, left (HCC) 04/12/2016  . Cellulitis and abscess of toe of left foot   . Cellulitis 04/03/2016  . AKI (acute kidney injury) (HCC) 04/03/2016  . Diabetes (HCC) 06/27/2013  . Hyperlipidemia with target low density lipoprotein (LDL) cholesterol less than 70 mg/dL 54/27/0623  . Essential hypertension, benign 06/27/2013  . Cerebrovascular disease 06/27/2013   Past Medical History:  Diagnosis Date  . Diabetes mellitus   . Hyperlipemia   . Hypertension   . Stroke [facility])     Family History  Problem Relation Age of Onset  . Stroke Father   . Hypertension Sister     Past Surgical History:    Procedure Laterality Date  . AMPUTATION Left 04/08/2016   Procedure: Left Great Toe Amputation at Metatarsophalangeal Joint;  Surgeon: Nadara Mustard, MD;  Location: [facility] OR;  Service: Orthopedics;  Laterality: Left;   Social History   Occupational History  . Not on file.   Social History Main Topics  . Smoking status: Former Games developer  . Smokeless tobacco: Former Neurosurgeon  . Alcohol use Yes  . Drug use: No  . Sexual activity: Yes    Partners: Female

## 2016-05-26 NOTE — Telephone Encounter (Signed)
FYI [facility] wanted to inform you that pt is also on Viagra and what you prescribed to pt was contraindicated. When she told pt he said he wasn't taking Viagra anymore and took the medication.  [phone]

## 2016-06-10 NOTE — Progress Notes (Signed)
Office Visit Note   Patient: Jason Benton           Date of Birth: 01/30/1956           MRN: [id number] Visit Date: 06/13/2016              Requested by: Marden Nobleobert Gates, MD [address] [place], [place] [address] PCP: Pearla DubonnetGATES,ROBERT NEVILL, MD  Chief Complaint  Patient presents with  . Left Foot - Follow-up    S/p Left Great Toe Amputation of Metatarsophalangeal Joint 04/08/16    HPI: Patient is a 61 y.o male who presents today for follow up of left foot. He was placed on nitroglycerin patch at last office visit in hopes to improve his microcirculation. Also is taking oral antibiotic bactrim and was counseled on importance nonweightbearing. He does feel he has made improvement since last office visit. He is dressing wound daily. He tries to keep weight off his foot the best he get. There is slight maceration Jason Benton, RT    Assessment & Plan: Visit Diagnoses:  1. Status post amputation of great toe, left (HCC)     Plan: Start antibiotic ointment dressing changes the wound has dried out at this point. Patient is given instructions for heel cord stretching to do daily. Patient request return to seated driving work and a note was provided for this.  Follow-Up Instructions: Return in about 4 weeks (around 07/11/2016).   Ortho Exam On examination the incision has a fairly thin 1 mm eschar around the edges there is no cellulitis no drainage no odor no signs of infection. The wound is about 1 mm diameter and runs entire length of the incision. ROS: Negative for fever or chills negative for cellulitis. Imaging: No results found.  Labs: Lab Results  Component Value Date   HGBA1C 8.6 (H) 04/04/2016   HGBA1C 9.7 04/24/2015   HGBA1C 10.4 04/17/2015   LABURIC 4.4 04/04/2016   REPTSTATUS 05/27/2011 FINAL 05/24/2011   CULT ESCHERICHIA COLI 05/24/2011   LABORGA ESCHERICHIA COLI 05/24/2011    Orders:  No orders of the defined types were placed in this  encounter.  No orders of the defined types were placed in this encounter.    Procedures: No procedures performed  Clinical Data: No additional findings.  Subjective: Review of Systems  Objective: Vital Signs: There were no vitals taken for this visit.  Specialty Comments:  No specialty comments available.  PMFS History: Patient Active Problem List   Diagnosis Date Noted  . BPH (benign prostatic hyperplasia) 04/16/2016  . Status post amputation of great toe, left (HCC) 04/12/2016  . Cellulitis and abscess of toe of left foot   . Cellulitis 04/03/2016  . AKI (acute kidney injury) (HCC) 04/03/2016  . Diabetes (HCC) 06/27/2013  . Hyperlipidemia with target low density lipoprotein (LDL) cholesterol less than 70 mg/dL 62/13/086504/04/2013  . Essential hypertension, benign 06/27/2013  . Cerebrovascular disease 06/27/2013   Past Medical History:  Diagnosis Date  . Diabetes mellitus   . Hyperlipemia   . Hypertension   . Stroke [facility](HCC)     Family History  Problem Relation Age of Onset  . Stroke Father   . Hypertension Sister     Past Surgical History:  Procedure Laterality Date  . AMPUTATION Left 04/08/2016   Procedure: Left Great Toe Amputation at Metatarsophalangeal Joint;  Surgeon: Nadara MustardMarcus V Alazae Crymes, MD;  [facility];  Service: Orthopedics;  Laterality: Left;   Social History   Occupational History  .  Not on file.   Social History Main Topics  . Smoking status: Former Games developer  . Smokeless tobacco: Former Neurosurgeon  . Alcohol use Yes  . Drug use: No  . Sexual activity: Yes    Partners: Female

## 2016-06-13 ENCOUNTER — Encounter (INDEPENDENT_AMBULATORY_CARE_PROVIDER_SITE_OTHER): Payer: Self-pay | Admitting: Orthopedic Surgery

## 2016-06-13 ENCOUNTER — Telehealth: Payer: Self-pay | Admitting: *Deleted

## 2016-06-13 ENCOUNTER — Ambulatory Visit (INDEPENDENT_AMBULATORY_CARE_PROVIDER_SITE_OTHER): Payer: BLUE CROSS/BLUE SHIELD | Admitting: Orthopedic Surgery

## 2016-06-13 DIAGNOSIS — Z89412 Acquired absence of left great toe: Secondary | ICD-10-CM

## 2016-06-13 NOTE — Telephone Encounter (Signed)
Jason HutchingSarah, [name] brought a note back from the ortho doctor saying he could return to work.   His Dot card is in box. To be filled out.

## 2016-06-14 NOTE — Telephone Encounter (Signed)
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

## 2016-07-15 ENCOUNTER — Ambulatory Visit (INDEPENDENT_AMBULATORY_CARE_PROVIDER_SITE_OTHER): Payer: BLUE CROSS/BLUE SHIELD | Admitting: Family

## 2016-07-15 ENCOUNTER — Encounter (INDEPENDENT_AMBULATORY_CARE_PROVIDER_SITE_OTHER): Payer: Self-pay | Admitting: Orthopedic Surgery

## 2016-07-15 VITALS — Ht 71.0 in | Wt 214.0 lb

## 2016-07-15 DIAGNOSIS — Z89412 Acquired absence of left great toe: Secondary | ICD-10-CM

## 2016-07-15 NOTE — Progress Notes (Signed)
Office Visit Note   Patient: Jason Benton           Date of Birth: 20-Nov-1955           MRN: [id number] Visit Date: 07/15/2016              Requested by: Marden Noble, MD [address] [place], [place] [address] PCP: Pearla Dubonnet, MD  Chief Complaint  Patient presents with  . Left Foot - Follow-up    04/08/16 left great toe amputation       HPI: The patient is 61 year old gentleman who presents today for evaluation of left great toe. He is status post left great toe amputation on January 12 of this year. He's been full weightbearing in regular shoewear. There is soft white tissue to the wound bed. Complains of minimal yellow drainage. Has been doing antibacterial ointment dressing changes daily. Denies pain. Denies swelling.  Assessment & Plan: Visit Diagnoses:  1. Status post amputation of great toe, left (HCC)     Plan: The patient will continue with daily wound care. Wound cleansing followed by antibacterial ointment dressing changes. Have provided a prescription for nitroglycerin patches which she will begin using.   Follow-Up Instructions: Return in about 4 weeks (around 08/12/2016).   Ortho Exam  Patient is alert, oriented, no adenopathy, well-dressed, normal affect, normal respiratory effort. On examination the incision has a fairly thin 1 mm of depth callus around the edges. there is no cellulitis no drainage no odor no signs of infection. The wound is about 2 mm in width and runs entire length of the incision.  Imaging: No results found.  Labs: Lab Results  Component Value Date   HGBA1C 8.6 (H) 04/04/2016   HGBA1C 9.7 04/24/2015   HGBA1C 10.4 04/17/2015   LABURIC 4.4 04/04/2016   REPTSTATUS 05/27/2011 FINAL 05/24/2011   CULT ESCHERICHIA COLI 05/24/2011   LABORGA ESCHERICHIA COLI 05/24/2011    Orders:  No orders of the defined types were placed in this encounter.  No orders of the defined types were placed in this encounter.    Procedures: No procedures performed  Clinical Data: No additional findings.  ROS:  All other systems negative, except as noted in the HPI. Review of Systems  Constitutional: Negative for chills and fever.  Cardiovascular: Positive for leg swelling.  Skin: Positive for color change and wound. Negative for rash.    Objective: Vital Signs: Ht  (1.803 m)   Wt 214 lb (97.1 kg)   BMI 29.85 kg/m   Specialty Comments:  No specialty comments available.  PMFS History: Patient Active Problem List   Diagnosis Date Noted  . BPH (benign prostatic hyperplasia) 04/16/2016  . Status post amputation of great toe, left (HCC) 04/12/2016  . AKI (acute kidney injury) (HCC) 04/03/2016  . Diabetes (HCC) 06/27/2013  . Hyperlipidemia with target low density lipoprotein (LDL) cholesterol less than 70 mg/dL 19/14/7829  . Essential hypertension, benign 06/27/2013  . Cerebrovascular disease 06/27/2013   Past Medical History:  Diagnosis Date  . Diabetes mellitus   . Hyperlipemia   . Hypertension   . Stroke [facility])     Family History  Problem Relation Age of Onset  . Stroke Father   . Hypertension Sister     Past Surgical History:  Procedure Laterality Date  . AMPUTATION Left 04/08/2016   Procedure: Left Great Toe Amputation at Metatarsophalangeal Joint;  Surgeon: Nadara Mustard, MD;  Location: [facility] OR;  Service: Orthopedics;  Laterality: Left;  Social History   Occupational History  . Not on file.   Social History Main Topics  . Smoking status: Former Games developer  . Smokeless tobacco: Former Neurosurgeon  . Alcohol use Yes  . Drug use: No  . Sexual activity: Yes    Partners: Female

## 2016-08-08 ENCOUNTER — Encounter (INDEPENDENT_AMBULATORY_CARE_PROVIDER_SITE_OTHER): Payer: Self-pay | Admitting: Orthopedic Surgery

## 2016-08-08 ENCOUNTER — Ambulatory Visit (INDEPENDENT_AMBULATORY_CARE_PROVIDER_SITE_OTHER): Payer: BLUE CROSS/BLUE SHIELD | Admitting: Family

## 2016-08-08 VITALS — Ht 71.0 in | Wt 214.0 lb

## 2016-08-08 DIAGNOSIS — L03116 Cellulitis of left lower limb: Secondary | ICD-10-CM | POA: Diagnosis not present

## 2016-08-08 DIAGNOSIS — Z89412 Acquired absence of left great toe: Secondary | ICD-10-CM | POA: Diagnosis not present

## 2016-08-08 MED ORDER — MUPIROCIN CALCIUM 2 % EX CREA: 1.0000 "application " | TOPICAL_CREAM | Freq: Every day | CUTANEOUS | 0 refills | Status: DC

## 2016-08-08 MED ORDER — DOXYCYCLINE HYCLATE 100 MG PO TABS: 100.0000 mg | ORAL_TABLET | Freq: Two times a day (BID) | ORAL | 0 refills | Status: AC

## 2016-08-08 NOTE — Progress Notes (Signed)
Office Visit Note   Patient: Jason Benton           Date of Birth: March 14, 1956           MRN: [id number] Visit Date: 08/08/2016              Requested by: Marden Noble, MD [address] [place], [place] [address] PCP: Marden Noble, MD  Chief Complaint  Patient presents with  . Left Foot - Follow-up    Left 5th ray amputation 4 months post op. Complaining of foul drainage for wound x 1 week.      HPI: The patient is 61 year old gentleman who presents today for evaluation of left first ray amputation. This has been slow to heal. Has been in Vive compression stockings with direct skin contact since last visit. is complaining of increased drainage with a foul odor.   Assessment & Plan: Visit Diagnoses:  No diagnosis found.  Plan: The patient will continue with daily wound care. Wound cleansing followed by antibacterial ointment dressing changes. Have provided an order for doxycycline. Discussed concern for further osteomyelitis. Patient voiced understanding. Discussed return precautions. Follow up in 2 weeks.   Follow-Up Instructions: Return in about 2 weeks (around 08/22/2016).   Ortho Exam  Patient is alert, oriented, no adenopathy, well-dressed, normal affect, normal respiratory effort. On examination the incision has macerated epibole. Distally the incision is open a diameter of 7 mm. This probes. The patient does not allow me to probe for depth. There is moderate clear foul drainage. There is erythema along the first ray amputation incision. Some eschar which he does not allow me to debride the full length of the incision. No ascending cellulitis. No purulence.   Imaging: No results found.  Labs: Lab Results  Component Value Date   HGBA1C 8.6 (H) 04/04/2016   HGBA1C 9.7 04/24/2015   HGBA1C 10.4 04/17/2015   LABURIC 4.4 04/04/2016   REPTSTATUS 05/27/2011 FINAL 05/24/2011   CULT ESCHERICHIA COLI 05/24/2011   LABORGA ESCHERICHIA COLI 05/24/2011    Orders:    No orders of the defined types were placed in this encounter.  No orders of the defined types were placed in this encounter.    Procedures: No procedures performed  Clinical Data: No additional findings.  ROS:  All other systems negative, except as noted in the HPI. Review of Systems  Constitutional: Negative for chills and fever.  Cardiovascular: Positive for leg swelling.  Skin: Positive for color change and wound. Negative for rash.    Objective: Vital Signs: Ht 5\' 11"  (1.803 m)   Wt 214 lb (97.1 kg)   BMI 29.85 kg/m   Specialty Comments:  No specialty comments available.  PMFS History: Patient Active Problem List   Diagnosis Date Noted  . BPH (benign prostatic hyperplasia) 04/16/2016  . Status post amputation of great toe, left (HCC) 04/12/2016  . AKI (acute kidney injury) (HCC) 04/03/2016  . Diabetes (HCC) 06/27/2013  . Hyperlipidemia with target low density lipoprotein (LDL) cholesterol less than 70 mg/dL 19/14/7829  . Essential hypertension, benign 06/27/2013  . Cerebrovascular disease 06/27/2013   Past Medical History:  Diagnosis Date  . Diabetes mellitus   . Hyperlipemia   . Hypertension   . Stroke [facility])     Family History  Problem Relation Age of Onset  . Stroke Father   . Hypertension Sister     Past Surgical History:  Procedure Laterality Date  . AMPUTATION Left 04/08/2016   Procedure: Left Great Toe  Amputation at Metatarsophalangeal Joint;  Surgeon: Nadara MustardMarcus V Duda, MD;  [facility];  Service: Orthopedics;  Laterality: Left;   Social History   Occupational History  . Not on file.   Social History Main Topics  . Smoking status: Former Games developermoker  . Smokeless tobacco: Former NeurosurgeonUser  . Alcohol use Yes  . Drug use: No  . Sexual activity: Yes    Partners: Female

## 2016-08-12 DIAGNOSIS — E1165 Type 2 diabetes mellitus with hyperglycemia: Secondary | ICD-10-CM | POA: Diagnosis not present

## 2016-08-12 DIAGNOSIS — G4700 Insomnia, unspecified: Secondary | ICD-10-CM | POA: Diagnosis not present

## 2016-08-12 DIAGNOSIS — Z89422 Acquired absence of other left toe(s): Secondary | ICD-10-CM | POA: Diagnosis not present

## 2016-08-12 DIAGNOSIS — I10 Essential (primary) hypertension: Secondary | ICD-10-CM | POA: Diagnosis not present

## 2016-08-19 ENCOUNTER — Ambulatory Visit (INDEPENDENT_AMBULATORY_CARE_PROVIDER_SITE_OTHER): Payer: BLUE CROSS/BLUE SHIELD | Admitting: Orthopedic Surgery

## 2016-09-02 ENCOUNTER — Ambulatory Visit (INDEPENDENT_AMBULATORY_CARE_PROVIDER_SITE_OTHER): Payer: BLUE CROSS/BLUE SHIELD | Admitting: Orthopedic Surgery

## 2016-09-02 ENCOUNTER — Encounter (INDEPENDENT_AMBULATORY_CARE_PROVIDER_SITE_OTHER): Payer: Self-pay | Admitting: Orthopedic Surgery

## 2016-09-02 VITALS — Ht 71.0 in | Wt 214.0 lb

## 2016-09-02 DIAGNOSIS — Z89412 Acquired absence of left great toe: Secondary | ICD-10-CM

## 2016-09-02 MED ORDER — MUPIROCIN CALCIUM 2 % EX CREA: 1.0000 "application " | TOPICAL_CREAM | Freq: Every day | CUTANEOUS | 0 refills | Status: AC

## 2016-09-02 NOTE — Progress Notes (Signed)
Office Visit Note   Patient: Jason Benton           Date of Birth: 01/08/1956           MRN: [id number] Visit Date: 09/02/2016              Requested by: Jason NobleGates, Robert, MD [address] [place], [place] [address] PCP: Jason NobleGates, Robert, MD  Chief Complaint  Patient presents with  . Left Foot - Follow-up    Left foot 1st ray amputation 6 months po      HPI: Patient presents follow-up status post left foot first ray amputation. He has completed his course of doxycycline he is using Bactroban daily and a nitroglycerin patch daily. Patient states that he is getting much better.  Assessment & Plan: Visit Diagnoses:  1. Status post amputation of great toe, left (HCC)     Plan: We'll continue with the Bactroban and nitroglycerin. Continue with the Darco shoe follow-up in 3 weeks  Follow-Up Instructions: Return in about 3 weeks (around 09/23/2016).   Ortho Exam  Patient is alert, oriented, no adenopathy, well-dressed, normal affect, normal respiratory effort. Examination patient does have some maceration around the wound edges. There is no purulence no cellulitis the wound is open 2 mm x 15 mm and approximately 5 mm deep  Imaging: No results found.  Labs: Lab Results  Component Value Date   HGBA1C 8.6 (H) 04/04/2016   HGBA1C 9.7 04/24/2015   HGBA1C 10.4 04/17/2015   LABURIC 4.4 04/04/2016   REPTSTATUS 05/27/2011 FINAL 05/24/2011   CULT ESCHERICHIA COLI 05/24/2011   LABORGA ESCHERICHIA COLI 05/24/2011    Orders:  No orders of the defined types were placed in this encounter.  Meds ordered this encounter  Medications  . mupirocin cream (BACTROBAN) 2 %    Sig: Apply 1 application topically daily.    Dispense:  30 g    Refill:  0     Procedures: No procedures performed  Clinical Data: No additional findings.  ROS:  All other systems negative, except as noted in the HPI. Review of Systems  Objective: Vital Signs: Ht 5\' 11"  (1.803 m)   Wt 214 lb  (97.1 kg)   BMI 29.85 kg/m   Specialty Comments:  No specialty comments available.  PMFS History: Patient Active Problem List   Diagnosis Date Noted  . BPH (benign prostatic hyperplasia) 04/16/2016  . Status post amputation of great toe, left (HCC) 04/12/2016  . AKI (acute kidney injury) (HCC) 04/03/2016  . Diabetes (HCC) 06/27/2013  . Hyperlipidemia with target low density lipoprotein (LDL) cholesterol less than 70 mg/dL 19/14/782904/04/2013  . Essential hypertension, benign 06/27/2013  . Cerebrovascular disease 06/27/2013   Past Medical History:  Diagnosis Date  . Diabetes mellitus   . Hyperlipemia   . Hypertension   . Stroke [facility](HCC)     Family History  Problem Relation Age of Onset  . Stroke Father   . Hypertension Sister     Past Surgical History:  Procedure Laterality Date  . AMPUTATION Left 04/08/2016   Procedure: Left Great Toe Amputation at Metatarsophalangeal Joint;  Surgeon: Nadara MustardMarcus V Maesyn Frisinger, MD;  [facility];  Service: Orthopedics;  Laterality: Left;   Social History   Occupational History  . Not on file.   Social History Main Topics  . Smoking status: Former Games developermoker  . Smokeless tobacco: Former NeurosurgeonUser  . Alcohol use Yes  . Drug use: No  . Sexual activity: Yes    Partners:  Female

## 2016-09-23 ENCOUNTER — Encounter (INDEPENDENT_AMBULATORY_CARE_PROVIDER_SITE_OTHER): Payer: Self-pay | Admitting: Orthopedic Surgery

## 2016-09-23 ENCOUNTER — Ambulatory Visit (INDEPENDENT_AMBULATORY_CARE_PROVIDER_SITE_OTHER): Payer: BLUE CROSS/BLUE SHIELD | Admitting: Orthopedic Surgery

## 2016-09-23 VITALS — Ht 71.0 in | Wt 214.0 lb

## 2016-09-23 DIAGNOSIS — Z89412 Acquired absence of left great toe: Secondary | ICD-10-CM

## 2016-09-23 NOTE — Progress Notes (Signed)
Office Visit Note   Patient: Jason LedgerJerry Stieg           Date of Birth: 01/15/1956           MRN: [id number] Visit Date: 09/23/2016              Requested by: Marden NobleGates, Robert, MD [address] [place], [place] [address] PCP: Marden NobleGates, Robert, MD  Chief Complaint  Patient presents with  . Right Foot - Routine Post Op    6 months left foot 1st ray amputation      HPI: Patient presents for follow-up 6 months status post left foot first ray amputation with persistent ischemic changes over the distal aspect of the incision.  Assessment & Plan: Visit Diagnoses:  1. Status post amputation of great toe, left (HCC)     Plan: Patient will continue to wake the wound open with gauze use Bactroban continue the nitroglycerin patch.  Follow-Up Instructions: Return in about 4 weeks (around 10/21/2016).   Ortho Exam  Patient is alert, oriented, no adenopathy, well-dressed, normal affect, normal respiratory effort. Examination patient has an antalgic gait. He does have a palpable pulse the incision is approximately three quarters healed with the proximal three quarters completely healed. The distal aspect is gaped open 1 mm and is about 5 mm deep by 15 mm in length. There is no cellulitis no purulence there is some clear drainage no signs of infection.  Imaging: No results found.  Labs: Lab Results  Component Value Date   HGBA1C 8.6 (H) 04/04/2016   HGBA1C 9.7 04/24/2015   HGBA1C 10.4 04/17/2015   LABURIC 4.4 04/04/2016   REPTSTATUS 05/27/2011 FINAL 05/24/2011   CULT ESCHERICHIA COLI 05/24/2011   LABORGA ESCHERICHIA COLI 05/24/2011    Orders:  No orders of the defined types were placed in this encounter.  No orders of the defined types were placed in this encounter.    Procedures: No procedures performed  Clinical Data: No additional findings.  ROS:  All other systems negative, except as noted in the HPI. Review of Systems  Objective: Vital Signs: Ht 5\' 11"  (1.803  m)   Wt 214 lb (97.1 kg)   BMI 29.85 kg/m   Specialty Comments:  No specialty comments available.  PMFS History: Patient Active Problem List   Diagnosis Date Noted  . BPH (benign prostatic hyperplasia) 04/16/2016  . Status post amputation of great toe, left (HCC) 04/12/2016  . AKI (acute kidney injury) (HCC) 04/03/2016  . Diabetes (HCC) 06/27/2013  . Hyperlipidemia with target low density lipoprotein (LDL) cholesterol less than 70 mg/dL 19/14/782904/04/2013  . Essential hypertension, benign 06/27/2013  . Cerebrovascular disease 06/27/2013   Past Medical History:  Diagnosis Date  . Diabetes mellitus   . Hyperlipemia   . Hypertension   . Stroke [facility](HCC)     Family History  Problem Relation Age of Onset  . Stroke Father   . Hypertension Sister     Past Surgical History:  Procedure Laterality Date  . AMPUTATION Left 04/08/2016   Procedure: Left Great Toe Amputation at Metatarsophalangeal Joint;  Surgeon: Nadara MustardMarcus V Shellia Hartl, MD;  [facility];  Service: Orthopedics;  Laterality: Left;   Social History   Occupational History  . Not on file.   Social History Main Topics  . Smoking status: Former Games developermoker  . Smokeless tobacco: Former NeurosurgeonUser  . Alcohol use Yes  . Drug use: No  . Sexual activity: Yes    Partners: Female

## 2016-09-26 ENCOUNTER — Other Ambulatory Visit (INDEPENDENT_AMBULATORY_CARE_PROVIDER_SITE_OTHER): Payer: Self-pay | Admitting: Orthopedic Surgery

## 2016-10-21 ENCOUNTER — Ambulatory Visit (INDEPENDENT_AMBULATORY_CARE_PROVIDER_SITE_OTHER): Payer: BLUE CROSS/BLUE SHIELD | Admitting: Family

## 2016-10-21 ENCOUNTER — Ambulatory Visit (INDEPENDENT_AMBULATORY_CARE_PROVIDER_SITE_OTHER): Payer: BLUE CROSS/BLUE SHIELD | Admitting: Orthopedic Surgery

## 2016-10-28 ENCOUNTER — Ambulatory Visit (INDEPENDENT_AMBULATORY_CARE_PROVIDER_SITE_OTHER): Payer: BLUE CROSS/BLUE SHIELD | Admitting: Family

## 2016-11-25 DIAGNOSIS — E119 Type 2 diabetes mellitus without complications: Secondary | ICD-10-CM | POA: Diagnosis not present

## 2017-01-06 DIAGNOSIS — E559 Vitamin D deficiency, unspecified: Secondary | ICD-10-CM | POA: Diagnosis not present

## 2017-01-06 DIAGNOSIS — E782 Mixed hyperlipidemia: Secondary | ICD-10-CM | POA: Diagnosis not present

## 2017-01-06 DIAGNOSIS — N5201 Erectile dysfunction due to arterial insufficiency: Secondary | ICD-10-CM | POA: Diagnosis not present

## 2017-01-06 DIAGNOSIS — Z23 Encounter for immunization: Secondary | ICD-10-CM | POA: Diagnosis not present

## 2017-01-06 DIAGNOSIS — E1165 Type 2 diabetes mellitus with hyperglycemia: Secondary | ICD-10-CM | POA: Diagnosis not present

## 2017-01-06 DIAGNOSIS — I10 Essential (primary) hypertension: Secondary | ICD-10-CM | POA: Diagnosis not present

## 2017-01-06 DIAGNOSIS — Z0001 Encounter for general adult medical examination with abnormal findings: Secondary | ICD-10-CM | POA: Diagnosis not present

## 2017-01-06 DIAGNOSIS — Z79899 Other long term (current) drug therapy: Secondary | ICD-10-CM | POA: Diagnosis not present

## 2017-01-06 DIAGNOSIS — Z125 Encounter for screening for malignant neoplasm of prostate: Secondary | ICD-10-CM | POA: Diagnosis not present

## 2017-03-08 ENCOUNTER — Ambulatory Visit: Payer: BLUE CROSS/BLUE SHIELD | Admitting: Physician Assistant

## 2017-03-08 DIAGNOSIS — S91332A Puncture wound without foreign body, left foot, initial encounter: Secondary | ICD-10-CM | POA: Diagnosis not present

## 2017-04-05 ENCOUNTER — Ambulatory Visit: Payer: Self-pay | Admitting: Physician Assistant

## 2017-04-05 ENCOUNTER — Encounter: Payer: Self-pay | Admitting: Physician Assistant

## 2017-04-05 ENCOUNTER — Other Ambulatory Visit: Payer: Self-pay

## 2017-04-05 VITALS — BP 138/64 | HR 83 | Temp 98.6°F | Ht 71.0 in | Wt 219.0 lb

## 2017-04-05 DIAGNOSIS — Z0289 Encounter for other administrative examinations: Secondary | ICD-10-CM

## 2017-04-05 NOTE — Progress Notes (Signed)
PRIMARY CARE AT [facility] [address] [place] [address] [phone]  Date:  04/05/2017   Name:  Jason Benton   DOB:  05/09/1955   MRN:  [id number]  PCP:  Jason Noble, MD    History of Present Illness:  Jason Benton is a 62 y.o. male patient who presents to PCP with  Chief Complaint  Patient presents with  . Employment Physical    CDL    No concerns or complaints at this time.    Patient Active Problem List   Diagnosis Date Noted  . BPH (benign prostatic hyperplasia) 04/16/2016  . Status post amputation of great toe, left (HCC) 04/12/2016  . AKI (acute kidney injury) (HCC) 04/03/2016  . Diabetes (HCC) 06/27/2013  . Hyperlipidemia with target low density lipoprotein (LDL) cholesterol less than 70 mg/dL 95/62/1308  . Essential hypertension, benign 06/27/2013  . Cerebrovascular disease 06/27/2013    Past Medical History:  Diagnosis Date  . Diabetes mellitus   . Hyperlipemia   . Hypertension   . Stroke [facility])     Past Surgical History:  Procedure Laterality Date  . AMPUTATION Left 04/08/2016   Procedure: Left Great Toe Amputation at Metatarsophalangeal Joint;  Surgeon: Jason Mustard, MD;  Location: [facility] OR;  Service: Orthopedics;  Laterality: Left;    Social History   Tobacco Use  . Smoking status: Former Games developer  . Smokeless tobacco: Former Engineer, water Use Topics  . Alcohol use: Yes  . Drug use: No    Family History  Problem Relation Age of Onset  . Stroke Father   . Hypertension Sister     Allergies  Allergen Reactions  . Shellfish Allergy     UNSPECIFIED REACTION     Medication list has been reviewed and updated.  Current Outpatient Medications on File Prior to Visit  Medication Sig Dispense Refill  . aspirin EC 325 MG tablet Take 325 mg by mouth daily.    . cholecalciferol (VITAMIN D) 1000 UNITS tablet Take 1,000 Units by mouth daily.    Marland Kitchen lisinopril-hydrochlorothiazide (PRINZIDE,ZESTORETIC) 10-12.5 MG per tablet Take 1 tablet by mouth daily.      . metFORMIN (GLUCOPHAGE) 500 MG tablet Take 2 tablets (1,000 mg total) by mouth 2 (two) times daily with a meal.    . doxycycline (VIBRA-TABS) 100 MG tablet Take 1 tablet (100 mg total) by mouth 2 (two) times daily. (Patient not taking: Reported on 04/05/2017) 60 tablet 0  . gabapentin (NEURONTIN) 100 MG capsule Take 1 capsule in the evening for one 1 week, then bid for 1 week, then tid (Patient not taking: Reported on 04/05/2017) 90 capsule 0  . gabapentin (NEURONTIN) 300 MG capsule TAKE ONE CAPSULE BY MOUTH THREE TIMES DAILY WHEN NECESSARY NEUROPATHY PAIN (Patient not taking: Reported on 04/05/2017) 90 capsule 0  . mupirocin cream (BACTROBAN) 2 % Apply 1 application topically daily. (Patient not taking: Reported on 04/05/2017) 30 g 0  . nitroGLYCERIN (NITRODUR - DOSED IN MG/24 HR) 0.2 mg/hr patch Place 1 patch (0.2 mg total) onto the skin daily. 30 patch 12  . oxyCODONE-acetaminophen (PERCOCET/ROXICET) 5-325 MG tablet Take 1-2 tablets by mouth every 6 (six) hours as needed for moderate pain or severe pain. (Patient not taking: Reported on 04/05/2017) 30 tablet 0  . polyethylene glycol (MIRALAX / GLYCOLAX) packet   0  . simvastatin (ZOCOR) 40 MG tablet Take 40 mg by mouth daily.     Marland Kitchen sulfamethoxazole-trimethoprim (BACTRIM DS,SEPTRA DS) 800-160 MG tablet Take 1 tablet by  mouth 2 (two) times daily. (Patient not taking: Reported on 04/05/2017) 60 tablet 0  . tamsulosin (FLOMAX) 0.4 MG CAPS capsule Take 1 capsule (0.4 mg total) by mouth daily. (Patient not taking: Reported on 04/05/2017) 30 capsule 0  . zolpidem (AMBIEN) 10 MG tablet   0   No current facility-administered medications on file prior to visit.     Review of Systems  Constitutional: Negative for chills and fever.  HENT: Negative for ear discharge, ear pain and sore throat.   Eyes: Negative for blurred vision and double vision.  Respiratory: Negative for cough, shortness of breath and wheezing.   Cardiovascular: Negative for chest pain,  palpitations and leg swelling.  Gastrointestinal: Negative for diarrhea, nausea and vomiting.  Genitourinary: Negative for dysuria, frequency and hematuria.  Skin: Negative for itching and rash.  Neurological: Negative for dizziness and headaches.   ROS otherwise unremarkable unless listed above.  Physical Examination: BP 138/64 (BP Location: Left Arm, Patient Position: Sitting, Cuff Size: Large)   Pulse 83   Temp 98.6 F (37 C) (Oral)   Ht 5\' 11"  (1.803 m)   Wt 219 lb (99.3 kg)   BMI 30.54 kg/m  Ideal Body Weight: Weight in (lb) to have BMI = 25: 178.9  Physical Exam  Constitutional: He is oriented to person, place, and time. He appears well-developed and well-nourished. No distress.  HENT:  Head: Normocephalic and atraumatic.  Right Ear: Tympanic membrane, external ear and ear canal normal.  Left Ear: Tympanic membrane, external ear and ear canal normal.  Eyes: Conjunctivae and EOM are normal. Pupils are equal, round, and reactive to light.  Cardiovascular: Normal rate and regular rhythm. Exam reveals no friction rub.  No murmur heard. Pulmonary/Chest: Effort normal. No respiratory distress. He has no wheezes.  Abdominal: Soft. Bowel sounds are normal. He exhibits no distension and no mass. There is no tenderness.  Musculoskeletal: Normal range of motion. He exhibits no edema or tenderness.  Neurological: He is alert and oriented to person, place, and time. He displays normal reflexes.  Skin: Skin is warm and dry. He is not diaphoretic.  Psychiatric: He has a normal mood and affect. His behavior is normal.    Visual Acuity Screening   Right eye Left eye Both eyes  Without correction: 20/25 20/25 20/25   With correction: 20/25 20/25 20/25   Comments: Peripheral left and right at 85 degrees  Hearing Screening Comments: Whisper test 15 ft \  Assessment and Plan: Jason LedgerJerry Benton is a 62 y.o. male who is here today for cc of dot 1 year certification.   Encounter for examination  required by Department of Transportation (DOT)  Jason PlattStephanie Gotti Alwin, PA-C [facility] 1/9/20191:25 PM

## 2017-04-05 NOTE — Patient Instructions (Signed)
     IF you received an x-ray today, you will receive an invoice from [facility]. Please contact [facility] at [phone] with questions or concerns regarding your invoice.   IF you received labwork today, you will receive an invoice from [facility]. Please contact [facility] at [phone] with questions or concerns regarding your invoice.   Our billing staff will not be able to assist you with questions regarding bills from these companies.  You will be contacted with the lab results as soon as they are available. The fastest way to get your results is to activate your My Chart account. Instructions are located on the last page of this paperwork. If you have not heard from us regarding the results in 2 weeks, please contact this office.     

## 2017-06-26 ENCOUNTER — Encounter: Payer: Self-pay | Admitting: Physician Assistant

## 2017-08-14 DIAGNOSIS — E1165 Type 2 diabetes mellitus with hyperglycemia: Secondary | ICD-10-CM | POA: Diagnosis not present

## 2017-08-14 DIAGNOSIS — Z79899 Other long term (current) drug therapy: Secondary | ICD-10-CM | POA: Diagnosis not present

## 2017-08-14 DIAGNOSIS — E782 Mixed hyperlipidemia: Secondary | ICD-10-CM | POA: Diagnosis not present

## 2017-08-14 DIAGNOSIS — I10 Essential (primary) hypertension: Secondary | ICD-10-CM | POA: Diagnosis not present

## 2018-01-16 DIAGNOSIS — E1140 Type 2 diabetes mellitus with diabetic neuropathy, unspecified: Secondary | ICD-10-CM | POA: Diagnosis not present

## 2018-01-16 DIAGNOSIS — Z23 Encounter for immunization: Secondary | ICD-10-CM | POA: Diagnosis not present

## 2018-01-16 DIAGNOSIS — E782 Mixed hyperlipidemia: Secondary | ICD-10-CM | POA: Diagnosis not present

## 2018-01-16 DIAGNOSIS — Z0000 Encounter for general adult medical examination without abnormal findings: Secondary | ICD-10-CM | POA: Diagnosis not present

## 2018-01-16 DIAGNOSIS — E1165 Type 2 diabetes mellitus with hyperglycemia: Secondary | ICD-10-CM | POA: Diagnosis not present

## 2018-01-16 DIAGNOSIS — Z125 Encounter for screening for malignant neoplasm of prostate: Secondary | ICD-10-CM | POA: Diagnosis not present

## 2018-01-16 DIAGNOSIS — N183 Chronic kidney disease, stage 3 (moderate): Secondary | ICD-10-CM | POA: Diagnosis not present

## 2018-01-16 DIAGNOSIS — E559 Vitamin D deficiency, unspecified: Secondary | ICD-10-CM | POA: Diagnosis not present

## 2018-01-16 DIAGNOSIS — I10 Essential (primary) hypertension: Secondary | ICD-10-CM | POA: Diagnosis not present

## 2018-04-05 ENCOUNTER — Ambulatory Visit: Payer: BLUE CROSS/BLUE SHIELD | Admitting: Emergency Medicine

## 2018-05-24 DIAGNOSIS — E782 Mixed hyperlipidemia: Secondary | ICD-10-CM | POA: Diagnosis not present

## 2018-05-24 DIAGNOSIS — Z79899 Other long term (current) drug therapy: Secondary | ICD-10-CM | POA: Diagnosis not present

## 2018-05-24 DIAGNOSIS — N183 Chronic kidney disease, stage 3 (moderate): Secondary | ICD-10-CM | POA: Diagnosis not present

## 2018-05-24 DIAGNOSIS — E1165 Type 2 diabetes mellitus with hyperglycemia: Secondary | ICD-10-CM | POA: Diagnosis not present

## 2018-05-24 DIAGNOSIS — I10 Essential (primary) hypertension: Secondary | ICD-10-CM | POA: Diagnosis not present

## 2018-08-21 IMAGING — US US RENAL
1 series · 14 of 22 positions shown · non-contrast
Comparison: None.

CLINICAL DATA: Acute renal injury

EXAM:
RENAL / URINARY TRACT ULTRASOUND COMPLETE

[Series 1: us renal · 0.26mm/px · 14 of 22 slices shown]
[im 1/22]
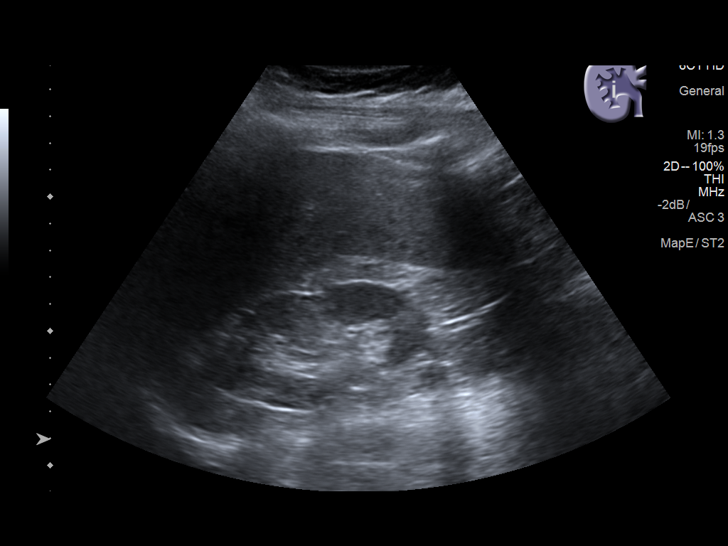
[im 3/22]
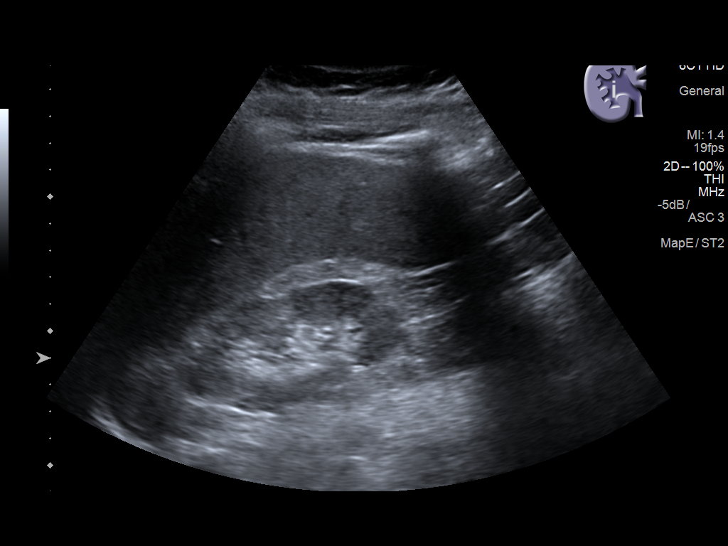
[im 4/22]
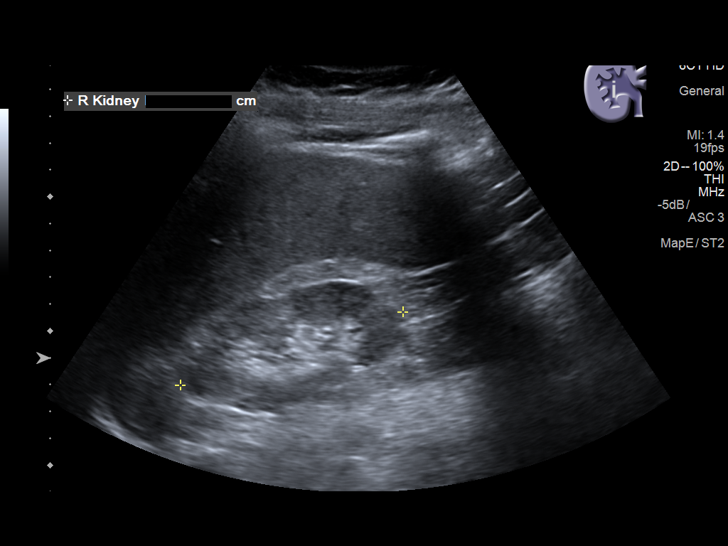
[im 6/22]
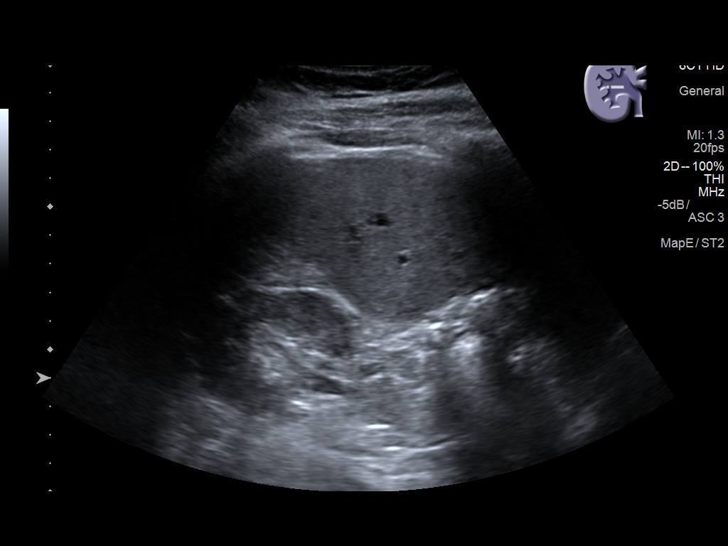
[im 8/22]
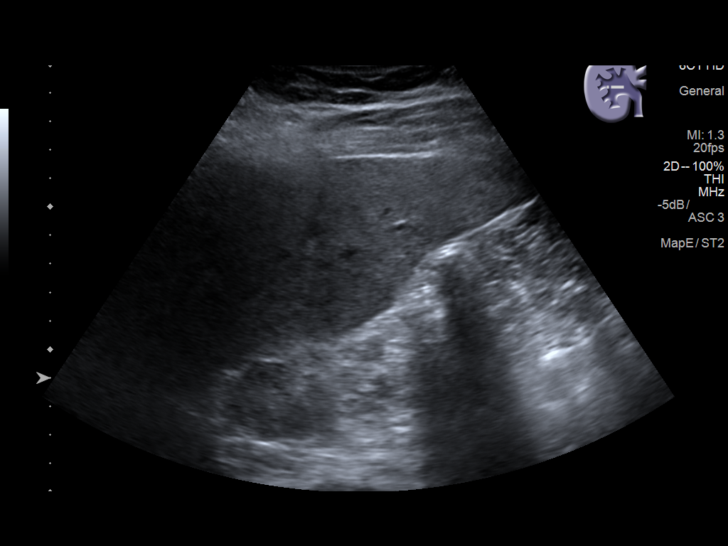
[im 9/22]
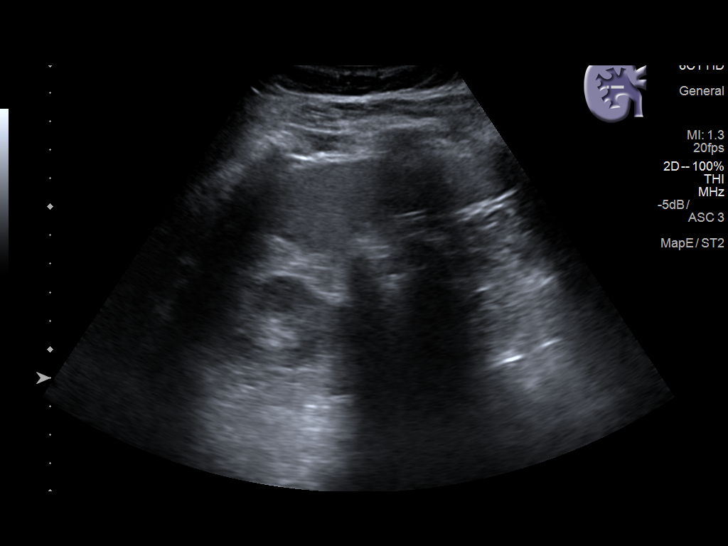
[im 11/22]
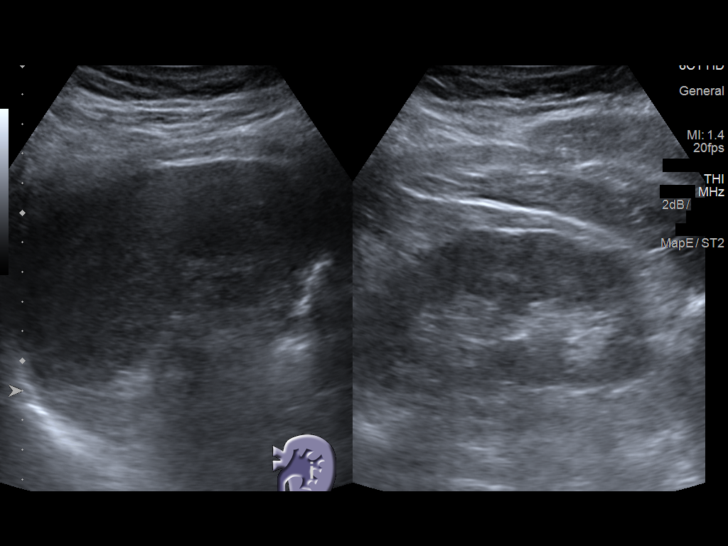
[im 12/22]
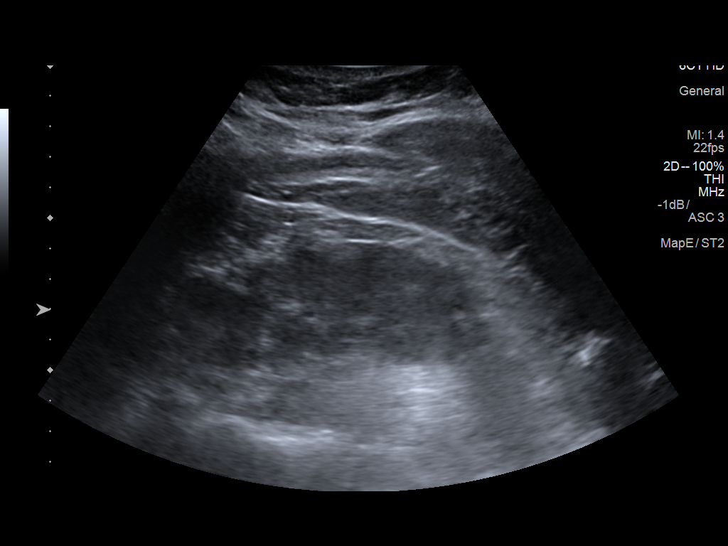
[im 14/22]
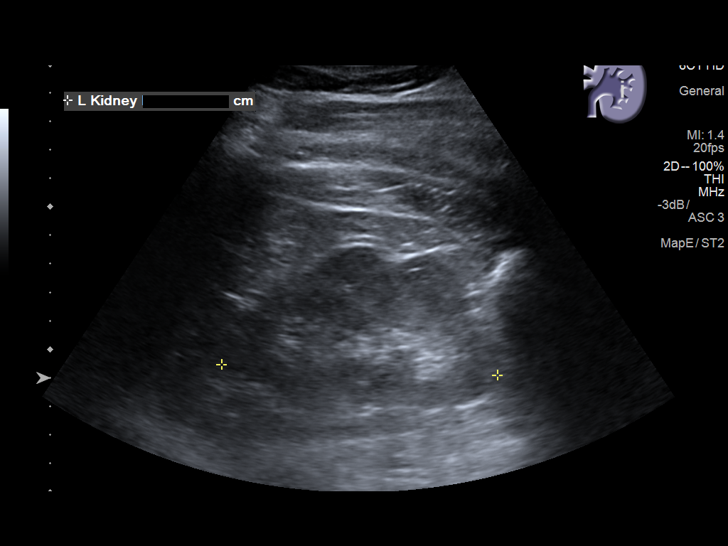
[im 15/22]
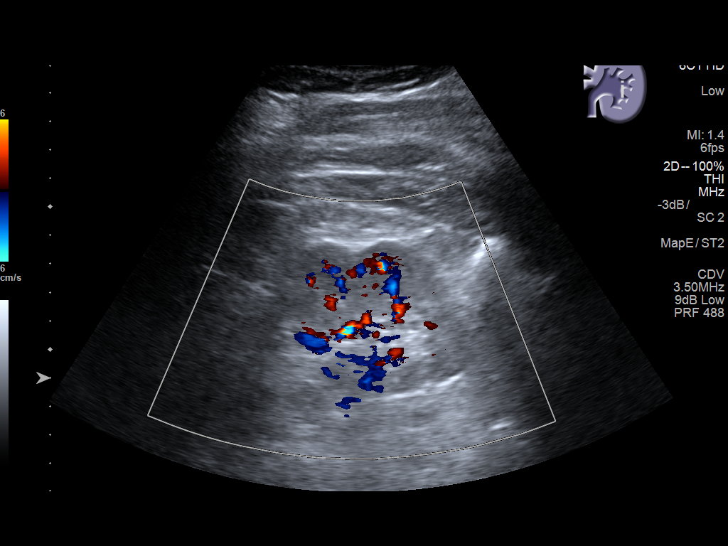
[im 17/22]
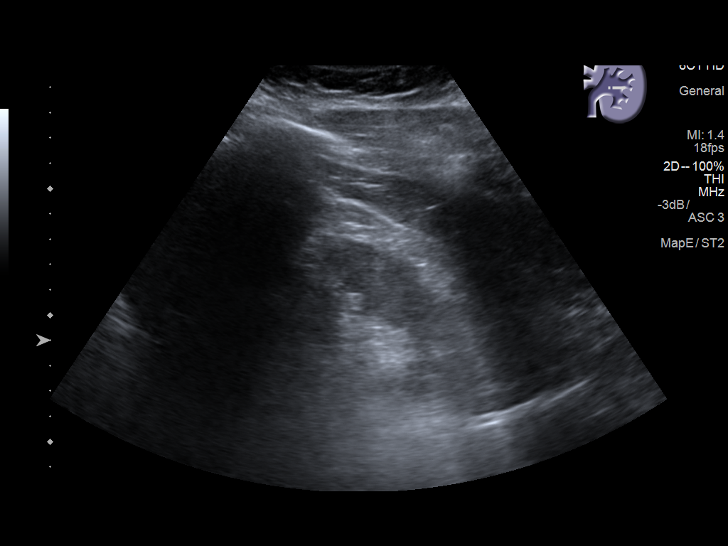
[im 19/22]
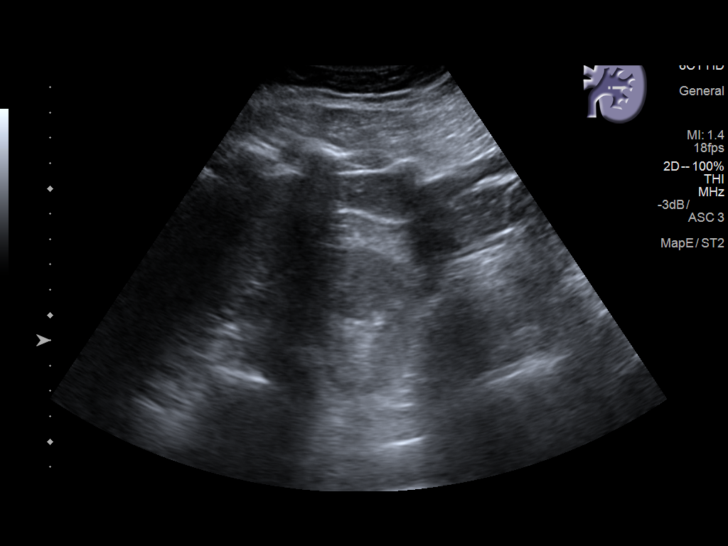
[im 20/22]
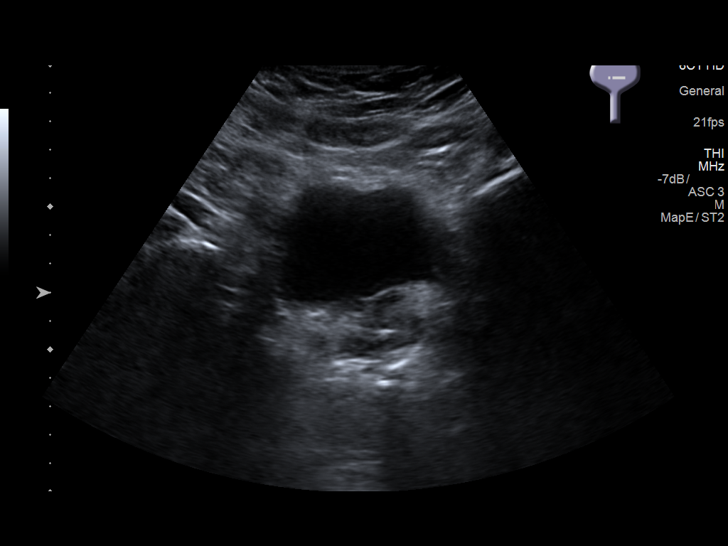
[im 22/22]
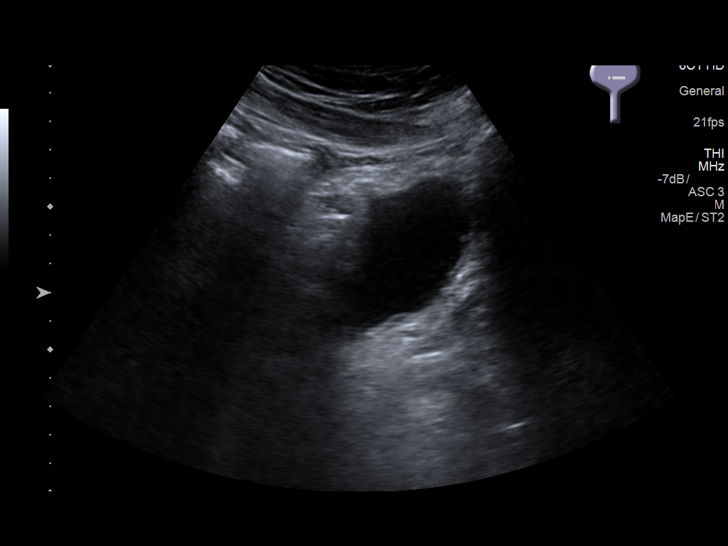

[14 of 22 positions shown; findings below may reference images not displayed]

FINDINGS: Right Kidney:

Length: 8.7 cm.. Echogenicity within normal limits. No mass or
hydronephrosis visualized.

Left Kidney:

Length: 9.7 cm.. Echogenicity within normal limits. No mass or
hydronephrosis visualized.

Bladder:

Appears normal for degree of bladder distention.
IMPRESSION: No acute abnormality noted.

## 2018-11-16 DIAGNOSIS — Z1211 Encounter for screening for malignant neoplasm of colon: Secondary | ICD-10-CM | POA: Diagnosis not present

## 2018-11-16 DIAGNOSIS — K648 Other hemorrhoids: Secondary | ICD-10-CM | POA: Diagnosis not present

## 2018-11-16 DIAGNOSIS — K5520 Angiodysplasia of colon without hemorrhage: Secondary | ICD-10-CM | POA: Diagnosis not present

## 2018-11-16 DIAGNOSIS — D123 Benign neoplasm of transverse colon: Secondary | ICD-10-CM | POA: Diagnosis not present

## 2019-02-04 DIAGNOSIS — Z89422 Acquired absence of other left toe(s): Secondary | ICD-10-CM | POA: Diagnosis not present

## 2019-02-04 DIAGNOSIS — N529 Male erectile dysfunction, unspecified: Secondary | ICD-10-CM | POA: Diagnosis not present

## 2019-02-04 DIAGNOSIS — R202 Paresthesia of skin: Secondary | ICD-10-CM | POA: Diagnosis not present

## 2019-02-04 DIAGNOSIS — Z79899 Other long term (current) drug therapy: Secondary | ICD-10-CM | POA: Diagnosis not present

## 2019-02-04 DIAGNOSIS — E559 Vitamin D deficiency, unspecified: Secondary | ICD-10-CM | POA: Diagnosis not present

## 2019-02-04 DIAGNOSIS — L723 Sebaceous cyst: Secondary | ICD-10-CM | POA: Diagnosis not present

## 2019-02-04 DIAGNOSIS — E1165 Type 2 diabetes mellitus with hyperglycemia: Secondary | ICD-10-CM | POA: Diagnosis not present

## 2019-02-04 DIAGNOSIS — E782 Mixed hyperlipidemia: Secondary | ICD-10-CM | POA: Diagnosis not present

## 2019-02-04 DIAGNOSIS — Z125 Encounter for screening for malignant neoplasm of prostate: Secondary | ICD-10-CM | POA: Diagnosis not present

## 2019-02-04 DIAGNOSIS — I10 Essential (primary) hypertension: Secondary | ICD-10-CM | POA: Diagnosis not present

## 2019-02-04 DIAGNOSIS — Z23 Encounter for immunization: Secondary | ICD-10-CM | POA: Diagnosis not present

## 2019-02-04 DIAGNOSIS — Z0000 Encounter for general adult medical examination without abnormal findings: Secondary | ICD-10-CM | POA: Diagnosis not present

## 2019-03-07 DIAGNOSIS — E1165 Type 2 diabetes mellitus with hyperglycemia: Secondary | ICD-10-CM | POA: Diagnosis not present

## 2019-03-07 DIAGNOSIS — N529 Male erectile dysfunction, unspecified: Secondary | ICD-10-CM | POA: Diagnosis not present

## 2019-03-07 DIAGNOSIS — L723 Sebaceous cyst: Secondary | ICD-10-CM | POA: Diagnosis not present

## 2019-03-07 DIAGNOSIS — I10 Essential (primary) hypertension: Secondary | ICD-10-CM | POA: Diagnosis not present

## 2019-08-20 DIAGNOSIS — E1140 Type 2 diabetes mellitus with diabetic neuropathy, unspecified: Secondary | ICD-10-CM | POA: Diagnosis not present

## 2019-08-20 DIAGNOSIS — E782 Mixed hyperlipidemia: Secondary | ICD-10-CM | POA: Diagnosis not present

## 2019-08-20 DIAGNOSIS — E1165 Type 2 diabetes mellitus with hyperglycemia: Secondary | ICD-10-CM | POA: Diagnosis not present

## 2019-08-20 DIAGNOSIS — Z89422 Acquired absence of other left toe(s): Secondary | ICD-10-CM | POA: Diagnosis not present

## 2019-08-20 DIAGNOSIS — I10 Essential (primary) hypertension: Secondary | ICD-10-CM | POA: Diagnosis not present

## 2019-08-20 DIAGNOSIS — N1831 Chronic kidney disease, stage 3a: Secondary | ICD-10-CM | POA: Diagnosis not present

## 2019-08-20 DIAGNOSIS — E559 Vitamin D deficiency, unspecified: Secondary | ICD-10-CM | POA: Diagnosis not present

## 2019-09-16 DIAGNOSIS — I10 Essential (primary) hypertension: Secondary | ICD-10-CM | POA: Diagnosis not present

## 2019-09-16 DIAGNOSIS — E1140 Type 2 diabetes mellitus with diabetic neuropathy, unspecified: Secondary | ICD-10-CM | POA: Diagnosis not present

## 2019-09-16 DIAGNOSIS — N1831 Chronic kidney disease, stage 3a: Secondary | ICD-10-CM | POA: Diagnosis not present

## 2019-09-16 DIAGNOSIS — E1165 Type 2 diabetes mellitus with hyperglycemia: Secondary | ICD-10-CM | POA: Diagnosis not present

## 2019-10-21 DIAGNOSIS — I10 Essential (primary) hypertension: Secondary | ICD-10-CM | POA: Diagnosis not present

## 2019-10-21 DIAGNOSIS — E1165 Type 2 diabetes mellitus with hyperglycemia: Secondary | ICD-10-CM | POA: Diagnosis not present

## 2019-10-21 DIAGNOSIS — E1140 Type 2 diabetes mellitus with diabetic neuropathy, unspecified: Secondary | ICD-10-CM | POA: Diagnosis not present

## 2019-10-21 DIAGNOSIS — E782 Mixed hyperlipidemia: Secondary | ICD-10-CM | POA: Diagnosis not present

## 2020-01-09 DIAGNOSIS — H40052 Ocular hypertension, left eye: Secondary | ICD-10-CM | POA: Diagnosis not present

## 2020-01-09 DIAGNOSIS — H52223 Regular astigmatism, bilateral: Secondary | ICD-10-CM | POA: Diagnosis not present

## 2020-01-09 DIAGNOSIS — E119 Type 2 diabetes mellitus without complications: Secondary | ICD-10-CM | POA: Diagnosis not present

## 2020-01-20 DIAGNOSIS — E1140 Type 2 diabetes mellitus with diabetic neuropathy, unspecified: Secondary | ICD-10-CM | POA: Diagnosis not present

## 2020-01-20 DIAGNOSIS — Z23 Encounter for immunization: Secondary | ICD-10-CM | POA: Diagnosis not present

## 2020-01-20 DIAGNOSIS — I10 Essential (primary) hypertension: Secondary | ICD-10-CM | POA: Diagnosis not present

## 2020-01-20 DIAGNOSIS — E1165 Type 2 diabetes mellitus with hyperglycemia: Secondary | ICD-10-CM | POA: Diagnosis not present

## 2020-03-03 DIAGNOSIS — E782 Mixed hyperlipidemia: Secondary | ICD-10-CM | POA: Diagnosis not present

## 2020-03-03 DIAGNOSIS — I10 Essential (primary) hypertension: Secondary | ICD-10-CM | POA: Diagnosis not present

## 2020-03-03 DIAGNOSIS — E1140 Type 2 diabetes mellitus with diabetic neuropathy, unspecified: Secondary | ICD-10-CM | POA: Diagnosis not present

## 2020-03-03 DIAGNOSIS — E1165 Type 2 diabetes mellitus with hyperglycemia: Secondary | ICD-10-CM | POA: Diagnosis not present

## 2020-04-09 ENCOUNTER — Other Ambulatory Visit: Payer: Self-pay | Admitting: Internal Medicine

## 2020-04-09 DIAGNOSIS — R4189 Other symptoms and signs involving cognitive functions and awareness: Secondary | ICD-10-CM

## 2020-04-24 ENCOUNTER — Other Ambulatory Visit: Payer: Self-pay

## 2020-04-24 ENCOUNTER — Ambulatory Visit
Admission: RE | Admit: 2020-04-24 | Discharge: 2020-04-24 | Disposition: A | Discharge status: Home / Self Care | Payer: 59 | Source: Ambulatory Visit | Attending: Internal Medicine | Admitting: Internal Medicine

## 2020-04-24 DIAGNOSIS — R4189 Other symptoms and signs involving cognitive functions and awareness: Secondary | ICD-10-CM

## 2020-06-02 ENCOUNTER — Ambulatory Visit: Payer: 59 | Admitting: Neurology

## 2020-06-30 ENCOUNTER — Encounter: Payer: Self-pay | Admitting: Neurology

## 2020-06-30 ENCOUNTER — Telehealth: Payer: Self-pay | Admitting: Neurology

## 2020-06-30 ENCOUNTER — Ambulatory Visit (INDEPENDENT_AMBULATORY_CARE_PROVIDER_SITE_OTHER): Payer: 59 | Admitting: Neurology

## 2020-06-30 VITALS — BP 118/60 | HR 88 | Ht 71.0 in | Wt 206.0 lb

## 2020-06-30 DIAGNOSIS — I10 Essential (primary) hypertension: Secondary | ICD-10-CM | POA: Diagnosis not present

## 2020-06-30 DIAGNOSIS — Z89412 Acquired absence of left great toe: Secondary | ICD-10-CM | POA: Diagnosis not present

## 2020-06-30 DIAGNOSIS — E1151 Type 2 diabetes mellitus with diabetic peripheral angiopathy without gangrene: Secondary | ICD-10-CM | POA: Diagnosis not present

## 2020-06-30 DIAGNOSIS — R413 Other amnesia: Secondary | ICD-10-CM

## 2020-06-30 NOTE — Progress Notes (Signed)
[facility]  PATIENT: Jason Benton DOB: [birth year]  REFERRING DOCTOR OR PCP: Marden Noble, MD SOURCE: Patient, notes from primary care, laboratory results, cognitive test results  _________________________________   HISTORICAL  CHIEF COMPLAINT:  Chief Complaint  Patient presents with  . New Patient (Initial Visit)    RM 12, alone. Paper referral from Marden Noble, MD for short term memory issues. Fell and hit head on left temple area a few weeks ago. Following up for that. Denies any memory issues. MOCA=19+1(8yr education)=20/30    HISTORY OF PRESENT ILLNESS:  I had the pleasure seeing your patient, Jason Benton, at [facility] for neurologic consultation regarding his short-term memory loss and difficulties at work.  He is a 65 year old man who has been noted to have memory problems over the past few months.   Family is noting more issues than Jason Benton notes.     His son had told him he is having more issues with memory loss.   He states he hit his head at work 2 months ago and problems started after that.    He denies that there was any difficulty with job performance, though Dr. Kevan Ny' note mentions that he was let go from 2 jobs due to issues at work.  At the second job he had a small accident.  He reports having well controlled Type 2 NIDDM (diagnosed > 10 years ago) and essential hypertension (> 10 years).   However, he had a toe amputation (left great toe) due to a non-healing ulcer and osteomyelitis.  Diabetic neuropathy is sometimes painful.  He still has foot pain and is on gabapentin three times a day with benefit and oxycodone rarely.     He denies depression.   He has insomnia, sleep maintenance > sleep onset.  He takes zolpidem some nights.  He has not been told he snores or that he has any OSA signs.     He had a CT scan of the head 04/24/2020 showing mild atrophy and small lacunar infarctions involving the genu of the right internal  capsule and in the corona radiata.  There is also chronic microvascular ischemic changes bilaterally.   Montreal Cognitive Assessment  06/30/2020  Visuospatial/ Executive (0/5) 4  Naming (0/3) 3  Attention: Read list of digits (0/2) 2  Attention: Read list of letters (0/1) 1  Attention: Serial 7 subtraction starting at 100 (0/3) 0  Language: Repeat phrase (0/2) 2  Language : Fluency (0/1) 0  Abstraction (0/2) 1  Delayed Recall (0/5) 0  Orientation (0/6) 6  Total 19  Adjusted Score (based on education) 20   Labs 05/28/2020: B12 466 pg/mL HgbA1c 7.9 % Crt = 1.7   REVIEW OF SYSTEMS: Constitutional: No fevers, chills, sweats, or change in appetite Eyes: No visual changes, double vision, eye pain Ear, nose and throat: No hearing loss, ear pain, nasal congestion, sore throat Cardiovascular: No chest pain, palpitations Respiratory: No shortness of breath at rest or with exertion.   No wheezes GastrointestinaI: No nausea, vomiting, diarrhea, abdominal pain, fecal incontinence Genitourinary: No dysuria, urinary retention or frequency.  No nocturia. Musculoskeletal: No neck pain, back pain Integumentary: No rash, pruritus, skin lesions Neurological: as above Psychiatric: No depression at this time.  No anxiety Endocrine: He has had a toe amputation due to diabetic ulcer. Hematologic/Lymphatic: No anemia, purpura, petechiae. Allergic/Immunologic: No itchy/runny eyes, nasal congestion, recent allergic reactions, rashes  ALLERGIES: Allergies  Allergen Reactions  . Shellfish Allergy     UNSPECIFIED  REACTION     HOME MEDICATIONS:  Current Outpatient Medications:  .  aspirin EC 325 MG tablet, Take 325 mg by mouth daily., Disp: , Rfl:  .  cholecalciferol (VITAMIN D) 1000 UNITS tablet, Take 1,000 Units by mouth daily., Disp: , Rfl:  .  doxycycline (VIBRA-TABS) 100 MG tablet, Take 1 tablet (100 mg total) by mouth 2 (two) times daily., Disp: 60 tablet, Rfl: 0 .  gabapentin (NEURONTIN)  100 MG capsule, Take 1 capsule in the evening for one 1 week, then bid for 1 week, then tid, Disp: 90 capsule, Rfl: 0 .  gabapentin (NEURONTIN) 300 MG capsule, TAKE ONE CAPSULE BY MOUTH THREE TIMES DAILY WHEN NECESSARY NEUROPATHY PAIN, Disp: 90 capsule, Rfl: 0 .  lisinopril-hydrochlorothiazide (PRINZIDE,ZESTORETIC) 10-12.5 MG per tablet, Take 1 tablet by mouth daily. , Disp: , Rfl:  .  metFORMIN (GLUCOPHAGE) 500 MG tablet, Take 2 tablets (1,000 mg total) by mouth 2 (two) times daily with a meal., Disp: , Rfl:  .  mupirocin cream (BACTROBAN) 2 %, Apply 1 application topically daily., Disp: 30 g, Rfl: 0 .  nitroGLYCERIN (NITRODUR - DOSED IN MG/24 HR) 0.2 mg/hr patch, Place 1 patch (0.2 mg total) onto the skin daily., Disp: 30 patch, Rfl: 12 .  oxyCODONE-acetaminophen (PERCOCET/ROXICET) 5-325 MG tablet, Take 1-2 tablets by mouth every 6 (six) hours as needed for moderate pain or severe pain., Disp: 30 tablet, Rfl: 0 .  polyethylene glycol (MIRALAX / GLYCOLAX) packet, , Disp: , Rfl: 0 .  simvastatin (ZOCOR) 40 MG tablet, Take 40 mg by mouth daily. , Disp: , Rfl:  .  sulfamethoxazole-trimethoprim (BACTRIM DS,SEPTRA DS) 800-160 MG tablet, Take 1 tablet by mouth 2 (two) times daily., Disp: 60 tablet, Rfl: 0 .  tamsulosin (FLOMAX) 0.4 MG CAPS capsule, Take 1 capsule (0.4 mg total) by mouth daily., Disp: 30 capsule, Rfl: 0 .  zolpidem (AMBIEN) 10 MG tablet, , Disp: , Rfl: 0  PAST MEDICAL HISTORY: Past Medical History:  Diagnosis Date  . Diabetes mellitus   . Hyperlipemia   . Hypertension   . Stroke [facility])     PAST SURGICAL HISTORY: Past Surgical History:  Procedure Laterality Date  . AMPUTATION Left 04/08/2016   Procedure: Left Great Toe Amputation at Metatarsophalangeal Joint;  Surgeon: Nadara Mustard, MD;  Location: [facility] OR;  Service: Orthopedics;  Laterality: Left;    FAMILY HISTORY: Family History  Problem Relation Age of Onset  . Hypertension Mother   . High Cholesterol Mother   . Stroke  Father   . Hypertension Father   . High Cholesterol Father   . Hypertension Sister     SOCIAL HISTORY:  Social History   Socioeconomic History  . Marital status: Single    Spouse name: Not on file  . Number of children: 1  . Years of education: 31  . Highest education level: Not on file  Occupational History  . Not on file  Tobacco Use  . Smoking status: Former Games developer  . Smokeless tobacco: Former Engineer, water and Sexual Activity  . Alcohol use: Yes    Comment: very little   . Drug use: No  . Sexual activity: Yes    Partners: Female  Other Topics Concern  . Not on file  Social History Narrative   Right handed   1 cup coffee per day   Lives with son   Social Determinants of Health   Financial Resource Strain: Not on file  Food Insecurity: Not on file  Transportation  Needs: Not on file  Physical Activity: Not on file  Stress: Not on file  Social Connections: Not on file  Intimate Partner Violence: Not on file     PHYSICAL EXAM  Vitals:   06/30/20 1040  BP: 118/60  Pulse: 88  SpO2: 99%  Weight: 206 lb (93.4 kg)  Height: 5\' 11"  (1.803 m)    Body mass index is 28.73 kg/m.   General: The patient is well-developed and well-nourished and in no acute distress  HEENT:  Head is [place]/AT.  Sclera are anicteric.  Funduscopic exam shows normal optic discs and retinal vessels.  Neck: No carotid bruits are noted.  The neck is nontender.  Cardiovascular: The heart has a regular rate and rhythm with a normal S1 and S2. There were no murmurs, gallops or rubs.    Skin: Extremities are without rash or  edema.  Musculoskeletal/Extremities:  Back is non-tender.  Has left great toe amputation  Neurologic Exam  Mental status: The patient is alert and oriented x 3 at the time of the examination.  He had reduced short-term memory and reduced focus/attention.  He had difficulty drawing a box but his clock was drawing well.  Speech is normal.  Cranial nerves:  Extraocular movements are full. Pupils are equal, round, and reactive to light and accomodation.  Visual fields are full.  Facial symmetry is present. There is good facial sensation to soft touch bilaterally.Facial strength is normal.  Trapezius and sternocleidomastoid strength is normal. No dysarthria is noted.  The tongue is midline, and the patient has symmetric elevation of the soft palate. No obvious hearing deficits are noted.  Motor:  Muscle bulk is normal.   Tone is normal. Strength is  5 / 5 in all 4 extremities.   Sensory: Sensory testing is intact to pinprick, soft touch and vibration sensation in arms and hyperpathia to pinprick in toes/distal foot.  Good vibration sensation in feet.    Coordination: Cerebellar testing reveals good finger-nose-finger and heel-to-shin bilaterally.  Gait and station: Station is normal.   Gait is slightly wide and he favors the left foot that had the amputation.. Tandem gait is near normal. Romberg is negative.   Reflexes: Deep tendon reflexes are symmetric and normal in arms, increased at knees (spread) and 2 at ankles.   Plantar responses are flexor.    DIAGNOSTIC DATA (LABS, IMAGING, TESTING) - I reviewed patient records, labs, notes, testing and imaging myself where available.  Lab Results  Component Value Date   WBC 8.6 04/09/2016   HGB 12.4 (L) 04/09/2016   HCT 36.7 (L) 04/09/2016   MCV 85.2 04/09/2016   PLT 218 04/09/2016      Component Value Date/Time   NA 135 04/09/2016 0407   K 4.1 04/09/2016 0407   CL 102 04/09/2016 0407   CO2 25 04/09/2016 0407   GLUCOSE 154 (H) 04/09/2016 0407   BUN 17 04/09/2016 0407   CREATININE 1.58 (H) 04/09/2016 0407   CALCIUM 9.1 04/09/2016 0407   GFRNONAA 46 (L) 04/09/2016 0407   GFRAA 53 (L) 04/09/2016 0407   No results found for: CHOL, HDL, LDLCALC, LDLDIRECT, TRIG, CHOLHDL Lab Results  Component Value Date   HGBA1C 8.6 (H) 04/04/2016   No results found for: VITAMINB12 No results found for:  TSH     ASSESSMENT AND PLAN  Memory loss - Plan: Thyroid Panel With TSH, RPR, Sedimentation rate, MR BRAIN WO CONTRAST   In summary, Mr. Harrold DonathCrump is a 65 year old man who has been  noted by family and others to have cognitive difficulties that likely began a few months ago.  He scored 20/30 on the [facility] cognitive assessment losing the most points for short-term delayed recall and focus/attention.  He also had difficulty drawing a cube.  I discussed with him that many different issues could cause memory loss.  I am most concerned about the possibility that he has a degenerative process such as Alzheimer's.  Additionally, he has multiple vascular risk factors with diabetes and hypertension and could also have multi-infarct cognitive changes.  Therefore, we will check an MRI of the brain without contrast to further assess the extent of vascular changes.  This can help to guide therapy.  We also discussed that medical issues can sometimes affect cognition.   He denies depression and he does not have OSA signs.  B12 was fine.  We need to check TSH, sed rate and RPR.  He will return to see me in 2 to 3 months but we will do additional testing or start a therapy if indicated by the imaging or laboratory studies.  Thank you for asking me to see Mr. Strite.  Please let me know can be of further assistance with him or other patients in the future.   Dorthey Depace A. Epimenio Foot, MD, [facility] 06/30/2020, 11:01 AM Certified in Neurology, Clinical Neurophysiology, Sleep Medicine and Neuroimaging  [facility] [address], [address] [place], [place] [address] [phone]

## 2020-06-30 NOTE — Telephone Encounter (Signed)
[facility]: [id number] (exp. 06/30/20 to 07/29/20) order sent to GI. They will reach out to the patient to schedule.

## 2020-07-01 ENCOUNTER — Telehealth: Payer: Self-pay | Admitting: *Deleted

## 2020-07-01 LAB — SEDIMENTATION RATE: Sed Rate: 6 mm/hr (ref 0–30)

## 2020-07-01 LAB — THYROID PANEL WITH TSH
Free Thyroxine Index: 1.4 (ref 1.2–4.9)
T3 Uptake Ratio: 27 % (ref 24–39)
T4, Total: 5.1 ug/dL (ref 4.5–12.0)
TSH: 0.535 u[IU]/mL (ref 0.450–4.500)

## 2020-07-01 LAB — RPR: RPR Ser Ql: NONREACTIVE

## 2020-07-01 NOTE — Telephone Encounter (Signed)
Called and spoke with pt about lab results per Dr. Sater's note. Pt verbalized understanding.  

## 2020-07-01 NOTE — Telephone Encounter (Signed)
-----   Message from Asa Lente, MD sent at 07/01/2020 12:50 PM EDT ----- Please let the patient know that the lab work is fine.

## 2020-07-01 NOTE — Progress Notes (Signed)
Called and spoke with pt about lab results per Dr. Sater's note. Pt verbalized understanding.  

## 2020-07-04 ENCOUNTER — Ambulatory Visit
Admission: RE | Admit: 2020-07-04 | Discharge: 2020-07-04 | Disposition: A | Discharge status: Home / Self Care | Payer: 59 | Source: Ambulatory Visit | Attending: Neurology | Admitting: Neurology

## 2020-07-04 ENCOUNTER — Other Ambulatory Visit: Payer: Self-pay

## 2020-07-04 DIAGNOSIS — R413 Other amnesia: Secondary | ICD-10-CM

## 2020-07-06 ENCOUNTER — Telehealth: Payer: Self-pay | Admitting: *Deleted

## 2020-07-06 MED ORDER — DONEPEZIL HCL 5 MG PO TABS: 5.0000 mg | ORAL_TABLET | Freq: Every day | ORAL | 0 refills | Status: AC

## 2020-07-06 NOTE — Telephone Encounter (Signed)
Called and spoke with pt. Relayed results per Dr. Bonnita Hollow note. He is agreeable to try donepezil. E-scribed to pharmacy on file.

## 2020-07-06 NOTE — Telephone Encounter (Signed)
-----   Message from Asa Lente, MD sent at 07/06/2020  4:23 PM EDT ----- The MRI of the brain showed some atrophy as well as a couple small strokes.  Most likely the memory problems are related to the atrophy.  I would like him to start donepezil 5 mg nightly.  If he tolerates it well we will titrate this up to a higher dose

## 2020-07-08 NOTE — Telephone Encounter (Signed)
Pt called, I can not remember what we discussed about my results, My son, Graciella Freer (on [place]) would like for the nurse to call him to discuss MRI results.  Contact info: [phone]

## 2020-07-08 NOTE — Telephone Encounter (Signed)
Called son back. Relayed results of MRI/labs. He wanted to know if two small stroke were new. I placed on hold and spoke w/ MD. Clent Demark per MD that they were not new, they were old strokes. Went over donepezil 5mg  tab po qhs x30 days. He will call he he tolerates well after a few weeks to get 10mg  tablet called in. Aware rx sent in on 07/06/20 to pharmacy. He will follow up on this. Reminded him next follow up is 10/14/20 at 11:30am. He verbalized understanding and appreciation.

## 2020-10-14 ENCOUNTER — Ambulatory Visit: Payer: 59 | Admitting: Neurology

## 2020-10-14 ENCOUNTER — Encounter: Payer: Self-pay | Admitting: Neurology

## 2020-10-28 ENCOUNTER — Telehealth: Payer: Self-pay | Admitting: Neurology

## 2020-10-28 NOTE — Telephone Encounter (Signed)
Called the son back. He answered but asked if he could call back. He will call back

## 2020-10-28 NOTE — Telephone Encounter (Signed)
Pt's son Caryn Bee on [place] called stating he has some questions about his father. Please give son a call back.

## 2021-02-05 DIAGNOSIS — Z23 Encounter for immunization: Secondary | ICD-10-CM | POA: Diagnosis not present

## 2021-02-05 DIAGNOSIS — E1165 Type 2 diabetes mellitus with hyperglycemia: Secondary | ICD-10-CM | POA: Diagnosis not present

## 2021-02-05 DIAGNOSIS — Z89422 Acquired absence of other left toe(s): Secondary | ICD-10-CM | POA: Diagnosis not present

## 2021-02-05 DIAGNOSIS — N1831 Chronic kidney disease, stage 3a: Secondary | ICD-10-CM | POA: Diagnosis not present

## 2021-02-05 DIAGNOSIS — E1140 Type 2 diabetes mellitus with diabetic neuropathy, unspecified: Secondary | ICD-10-CM | POA: Diagnosis not present

## 2021-02-05 DIAGNOSIS — Z7984 Long term (current) use of oral hypoglycemic drugs: Secondary | ICD-10-CM | POA: Diagnosis not present

## 2021-02-05 DIAGNOSIS — R8290 Unspecified abnormal findings in urine: Secondary | ICD-10-CM | POA: Diagnosis not present

## 2021-02-05 DIAGNOSIS — R202 Paresthesia of skin: Secondary | ICD-10-CM | POA: Diagnosis not present

## 2021-05-10 DIAGNOSIS — E1140 Type 2 diabetes mellitus with diabetic neuropathy, unspecified: Secondary | ICD-10-CM | POA: Diagnosis not present

## 2021-05-10 DIAGNOSIS — E782 Mixed hyperlipidemia: Secondary | ICD-10-CM | POA: Diagnosis not present

## 2021-05-10 DIAGNOSIS — Z1211 Encounter for screening for malignant neoplasm of colon: Secondary | ICD-10-CM | POA: Diagnosis not present

## 2021-05-10 DIAGNOSIS — Z7984 Long term (current) use of oral hypoglycemic drugs: Secondary | ICD-10-CM | POA: Diagnosis not present

## 2021-05-10 DIAGNOSIS — E1165 Type 2 diabetes mellitus with hyperglycemia: Secondary | ICD-10-CM | POA: Diagnosis not present

## 2021-05-10 DIAGNOSIS — N1831 Chronic kidney disease, stage 3a: Secondary | ICD-10-CM | POA: Diagnosis not present

## 2021-05-10 DIAGNOSIS — Z89422 Acquired absence of other left toe(s): Secondary | ICD-10-CM | POA: Diagnosis not present

## 2021-05-10 DIAGNOSIS — E559 Vitamin D deficiency, unspecified: Secondary | ICD-10-CM | POA: Diagnosis not present

## 2021-09-27 ENCOUNTER — Telehealth: Payer: Self-pay | Admitting: Neurology

## 2021-09-27 DIAGNOSIS — S0990XA Unspecified injury of head, initial encounter: Secondary | ICD-10-CM | POA: Diagnosis not present

## 2021-09-27 DIAGNOSIS — S0181XA Laceration without foreign body of other part of head, initial encounter: Secondary | ICD-10-CM | POA: Diagnosis not present

## 2021-09-27 DIAGNOSIS — R4781 Slurred speech: Secondary | ICD-10-CM | POA: Diagnosis not present

## 2021-09-27 DIAGNOSIS — Z23 Encounter for immunization: Secondary | ICD-10-CM | POA: Diagnosis not present

## 2021-09-27 DIAGNOSIS — S199XXA Unspecified injury of neck, initial encounter: Secondary | ICD-10-CM | POA: Diagnosis not present

## 2021-09-27 DIAGNOSIS — M47812 Spondylosis without myelopathy or radiculopathy, cervical region: Secondary | ICD-10-CM | POA: Diagnosis not present

## 2021-09-27 DIAGNOSIS — R262 Difficulty in walking, not elsewhere classified: Secondary | ICD-10-CM | POA: Diagnosis not present

## 2021-09-27 DIAGNOSIS — S01111A Laceration without foreign body of right eyelid and periocular area, initial encounter: Secondary | ICD-10-CM | POA: Diagnosis not present

## 2021-09-27 DIAGNOSIS — S098XXA Other specified injuries of head, initial encounter: Secondary | ICD-10-CM | POA: Diagnosis not present

## 2021-09-27 DIAGNOSIS — F10129 Alcohol abuse with intoxication, unspecified: Secondary | ICD-10-CM | POA: Diagnosis not present

## 2021-09-27 NOTE — Telephone Encounter (Signed)
Called back. Scheduled work in appt for 09/30/21 at 9am with Dr. Epimenio Foot. Asked they check in by 830am.  Memory has slowly worsened since last appt 06/2020. Stopped aricept. Does not think he has UTI right now.

## 2021-09-27 NOTE — Telephone Encounter (Signed)
Memory loss has gotten worse. Last night pt was 4 hours away from home in [place], [place]. Was going in the wrong direction on the highway. Would like a call from the nurse to discuss a earlier appt to have him evaluated again.

## 2021-09-30 ENCOUNTER — Ambulatory Visit: Payer: Self-pay | Admitting: Neurology

## 2021-11-10 DIAGNOSIS — E1165 Type 2 diabetes mellitus with hyperglycemia: Secondary | ICD-10-CM | POA: Diagnosis not present

## 2021-11-10 DIAGNOSIS — E1140 Type 2 diabetes mellitus with diabetic neuropathy, unspecified: Secondary | ICD-10-CM | POA: Diagnosis not present

## 2021-11-10 DIAGNOSIS — N1831 Chronic kidney disease, stage 3a: Secondary | ICD-10-CM | POA: Diagnosis not present

## 2021-11-10 DIAGNOSIS — R410 Disorientation, unspecified: Secondary | ICD-10-CM | POA: Diagnosis not present

## 2021-11-10 DIAGNOSIS — E782 Mixed hyperlipidemia: Secondary | ICD-10-CM | POA: Diagnosis not present

## 2021-11-10 DIAGNOSIS — Z89429 Acquired absence of other toe(s), unspecified side: Secondary | ICD-10-CM | POA: Diagnosis not present

## 2021-11-10 DIAGNOSIS — G4700 Insomnia, unspecified: Secondary | ICD-10-CM | POA: Diagnosis not present

## 2021-11-10 DIAGNOSIS — E559 Vitamin D deficiency, unspecified: Secondary | ICD-10-CM | POA: Diagnosis not present

## 2022-01-17 ENCOUNTER — Ambulatory Visit: Payer: Medicare HMO | Admitting: Neurology

## 2022-04-10 IMAGING — CT CT HEAD W/O CM
2 series · 15 of 30 positions shown, 17 images · non-contrast
Comparison: Report from MRI/MRA head 12/15/2001 (images
unavailable). Head CT 12/14/2001 (images unavailable).

CLINICAL DATA: Cognitive change. Additional history provided by
scanning technologist: Cognitive changes, history of stroke,
hypertension, patient reports fall off truck 2 weeks ago hitting
front left side of head.

EXAM:
CT HEAD WITHOUT CONTRAST
TECHNIQUE: Contiguous axial images were obtained from the base of the skull
through the vertex without intravenous contrast.

[Series 2: head w/(date) · axial · 0.49mm/px · z∈[-156,-36]mm · 7 of 33 slices shown, 9 images]
[im 5/33  brain]
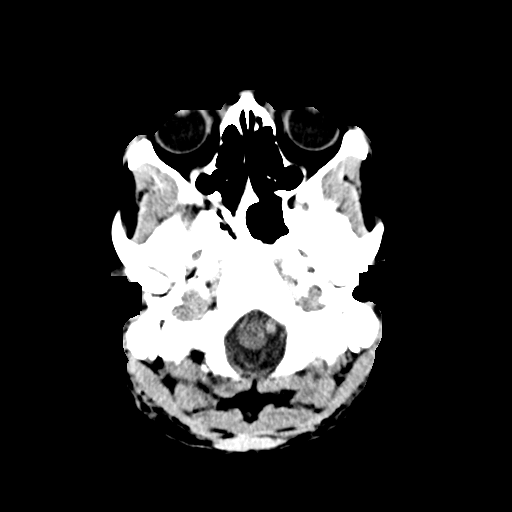
[im 5/33  bone]
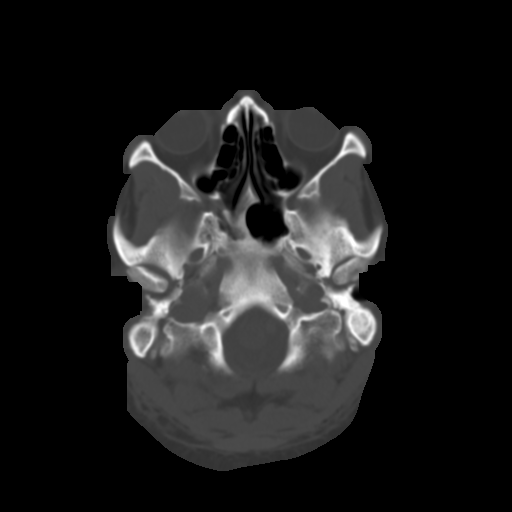
[im 9/33  brain]
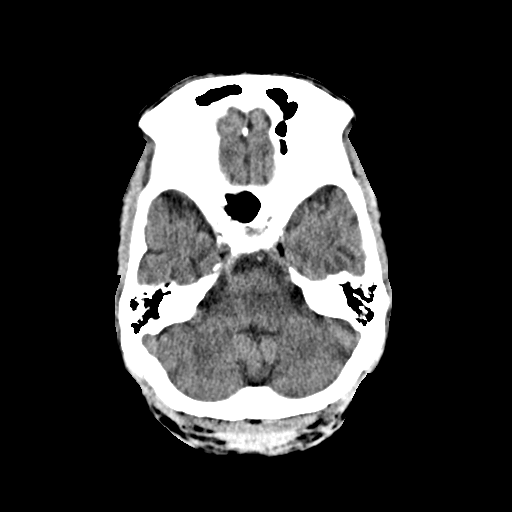
[im 13/33  brain]
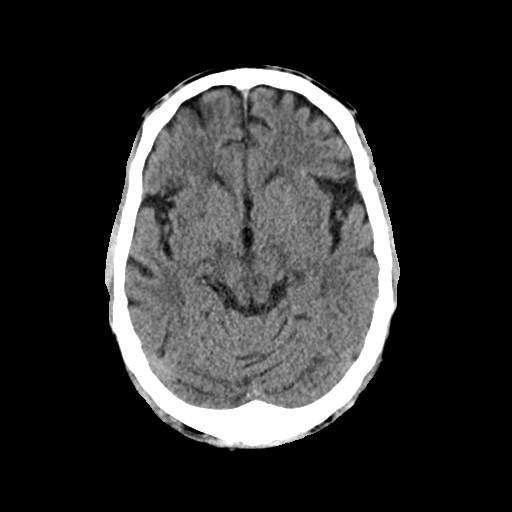
[im 17/33  brain]
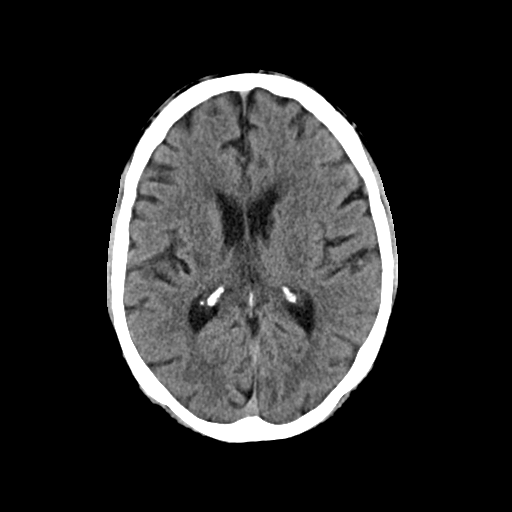
[im 21/33  brain]
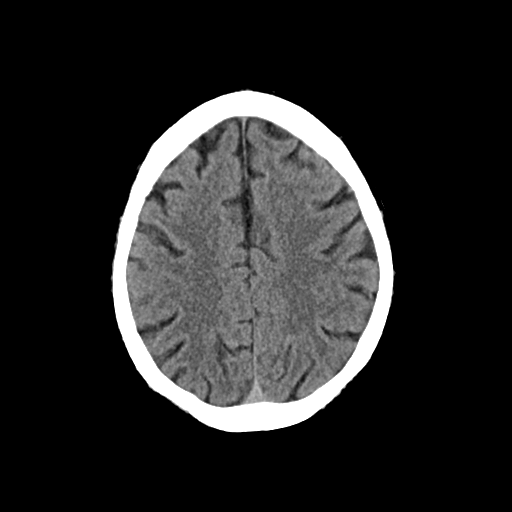
[im 21/33  bone]
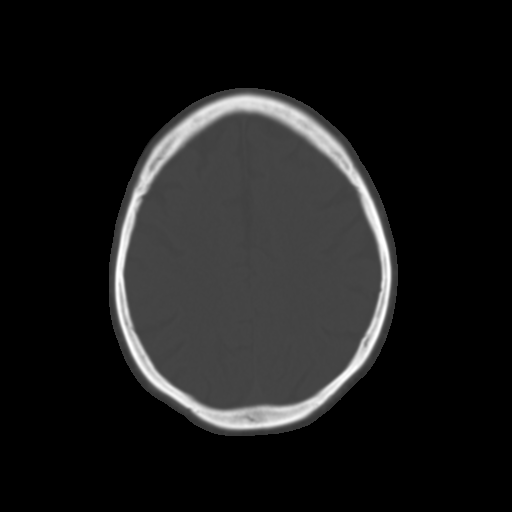
[im 25/33  brain]
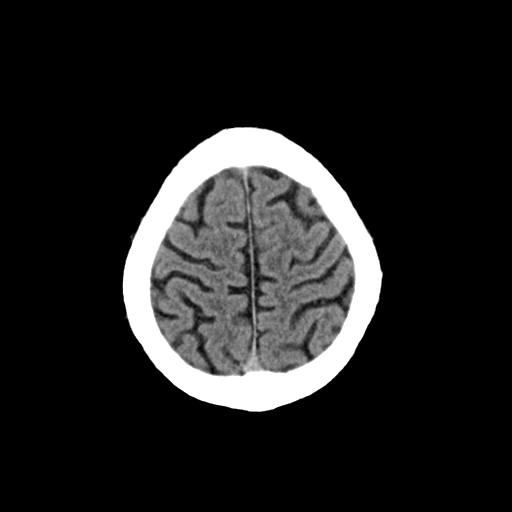
[im 29/33  brain]
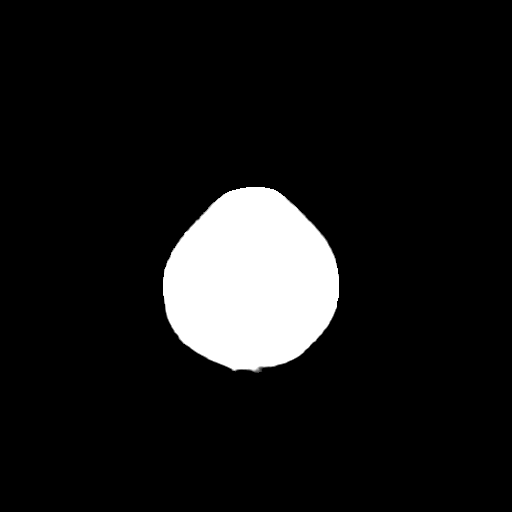

[Series 3: bone · axial · 0.49mm/px · z∈[-160,-30]mm · 8 of 83 slices shown]
[im 9/83  bone]
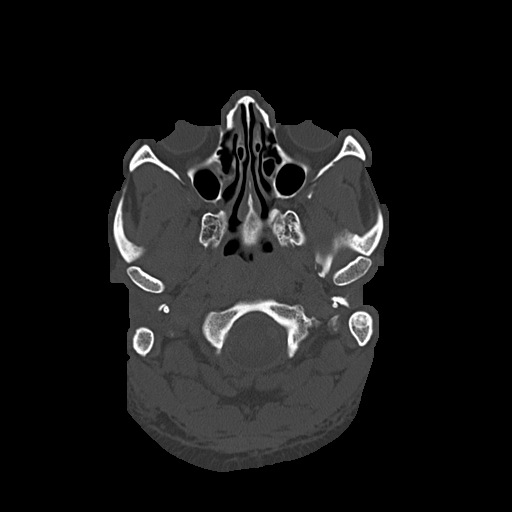
[im 17/83  bone]
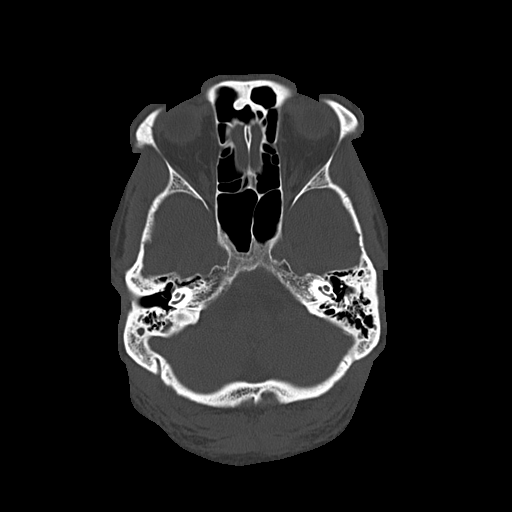
[im 25/83  bone]
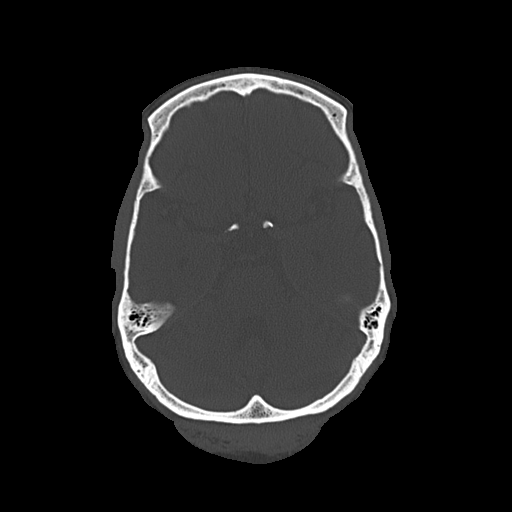
[im 37/83  bone]
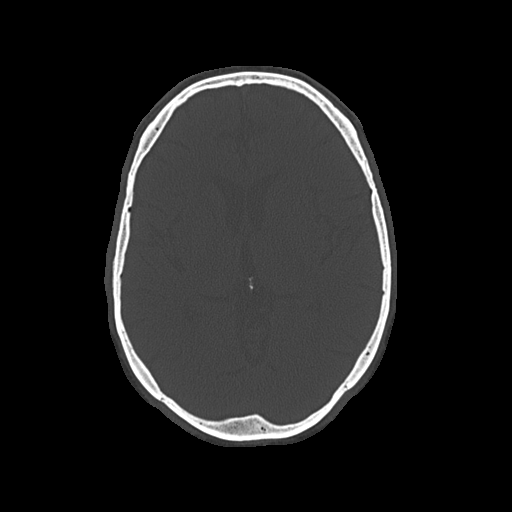
[im 46/83  bone]
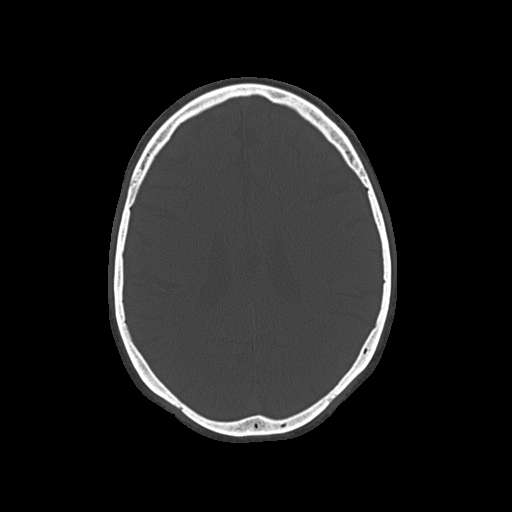
[im 58/83  bone]
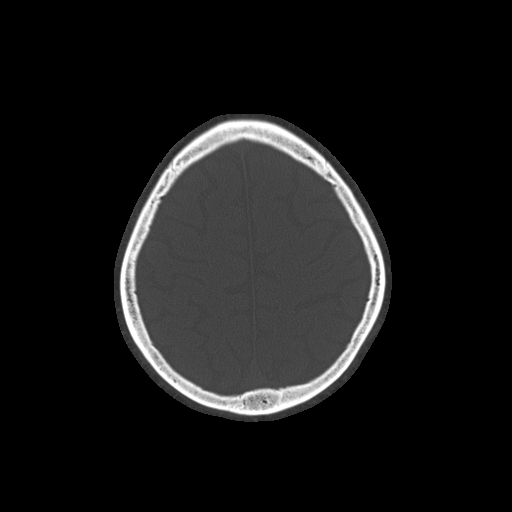
[im 66/83  bone]
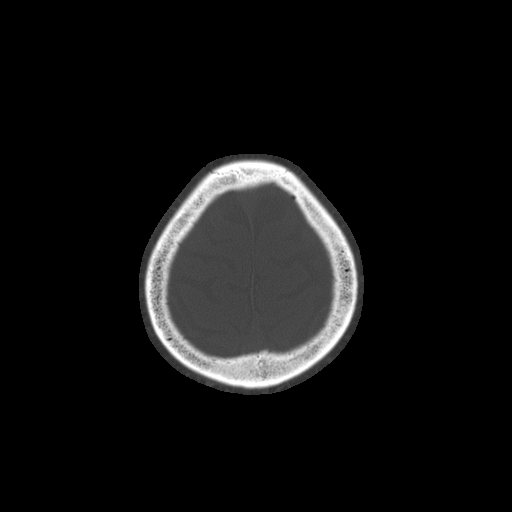
[im 74/83  bone]
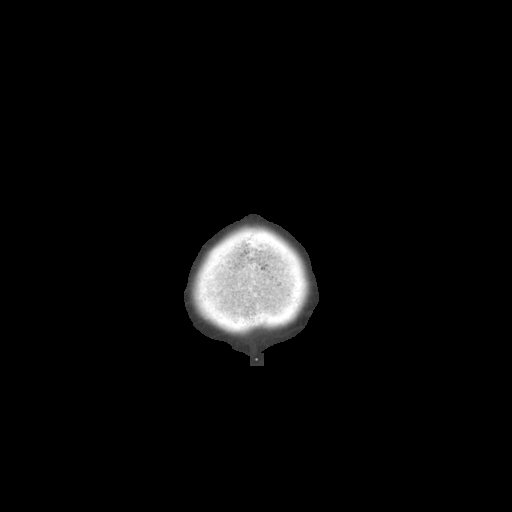

[15 of 30 positions shown; findings below may reference images not displayed]

FINDINGS: Brain:

Mild cerebral and cerebellar atrophy.

Chronic small-vessel infarct within the right corona
radiata/internal capsule. Chronic lacunar infarct within the right
internal capsule genu (series 2, image 15).

Chronic small-vessel infarct within the genu of the right internal
capsule and also suspected within the right corona radiata.

There is no acute intracranial hemorrhage.

No demarcated cortical infarct.

No extra-axial fluid collection.

No evidence of intracranial mass.

No midline shift.

Vascular: No hyperdense vessel.  Atherosclerotic calcifications.

Skull: Normal. Negative for fracture or focal lesion.

Sinuses/Orbits: Visualized orbits show no acute finding. No
significant paranasal sinus disease at the imaged levels.

Other: Trace fluid within the right mastoid air cells.
IMPRESSION: No evidence of acute intracranial abnormality.

Mild generalized atrophy of the brain.

Chronic small-vessel infarcts within the genu of the right internal
capsule and also suspected within the right corona radiata.

Trace right mastoid effusion.

## 2022-05-11 DIAGNOSIS — R6889 Other general symptoms and signs: Secondary | ICD-10-CM | POA: Diagnosis not present

## 2022-06-08 DIAGNOSIS — R6889 Other general symptoms and signs: Secondary | ICD-10-CM | POA: Diagnosis not present

## 2022-07-05 DIAGNOSIS — R6889 Other general symptoms and signs: Secondary | ICD-10-CM | POA: Diagnosis not present

## 2022-07-06 DIAGNOSIS — R6889 Other general symptoms and signs: Secondary | ICD-10-CM | POA: Diagnosis not present

## 2022-07-27 DIAGNOSIS — R6889 Other general symptoms and signs: Secondary | ICD-10-CM | POA: Diagnosis not present

## 2022-09-13 DIAGNOSIS — R6889 Other general symptoms and signs: Secondary | ICD-10-CM | POA: Diagnosis not present

## 2023-12-21 ENCOUNTER — Encounter (HOSPITAL_COMMUNITY): Payer: Self-pay | Admitting: Family Medicine
